# Patient Record
Sex: Male | Born: 1995 | Race: Black or African American | Hispanic: No | Marital: Single | Smoking: Never smoker
Health system: Southern US, Community
[De-identification: ages and names within clinical notes are randomized; demographics above are authoritative.]

## PROBLEM LIST (undated history)

## (undated) DIAGNOSIS — F603 Borderline personality disorder: Secondary | ICD-10-CM

## (undated) DIAGNOSIS — R6250 Unspecified lack of expected normal physiological development in childhood: Secondary | ICD-10-CM

## (undated) DIAGNOSIS — F913 Oppositional defiant disorder: Secondary | ICD-10-CM

## (undated) DIAGNOSIS — F840 Autistic disorder: Secondary | ICD-10-CM

---
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
## 2014-10-11 NOTE — Discharge Summary (Signed)
PATIENT NAME:  Jesse Mckenzie, Jesse Mckenzie MR#:  [id number] DATE OF BIRTH:  08-12-1995  DATE OF ADMISSION:  08/13/2013 DATE OF DISCHARGE:  08/16/2013   HOSPITAL COURSE: See dictated history and physical for details of admission. This 19 year old man was admitted to the hospital with aggressive and threatening behavior at his group home. He has a history of autistic spectrum disorder and treatment for bipolar disorder. The patient did not appear to be manic, but did give a history of mood swings and easy angering and irritability. It appears that he has just relatively recently been moved into a group home, and he was very fixated on the idea that he wanted to go back and live with his mother. It took quite a while for him to understand that this was not going to be possible. In the hospital here, however, he did not engage in any violent or dangerous behavior. He cooperated and participated in groups and individual therapy appropriately. We continued him on his Klonopin, Depakote, and Seroquel and he tolerated these medicines well. At one point, the patient complained to me that he also was having hallucinations and felt like there were monsters crawling on him in his room at night, but he never acted like he was responding to internal stimuli, and at the time of discharge, he is denying all these symptoms. We have spoken with his guardian and his group home. He is okay to be discharged back there today. The patient is expressing better insight and promising not to engage in dangerous behavior. He has been counseled about the importance of keeping his cool and cooperating with treatment and his living situation if he wants to eventually get what he wants.   MENTAL STATUS EXAM AT DISCHARGE: Neatly dressed and groomed young man, looks his stated age. Passively cooperative. Eye contact intermittent. Psychomotor activity sluggish and slow. Speech is slow but easy to understand, somewhat flat tone. Affect is a little bit  blunted and constricted, which seems to be his norm. Mood is stated as okay or good. Thoughts are still concrete and slow but not bizarre. Did not make any obviously delusional statements. Denies that he is having any hallucinations. Denies any suicidal or homicidal ideation. States that he has a better understanding that he needs to behave himself and will not be getting aggressive at home. The patient's judgment and insight are still somewhat impaired but improved. Short-term memory grossly intact, long-term memory grossly intact. Fund of knowledge normal. Alert and oriented x 4.   DISCHARGE MEDICATIONS: Seroquel 800 mg p.o. at night extended-release, clonazepam 1 mg twice a day, Depakote 1500 mg at night.   LABORATORY RESULTS: For this admission, his drug screen was positive for tricyclics, probably relating to the Seroquel. The urinalysis was entirely normal. The CBC showed a low platelet count at 121. Chemistry panel: Elevated CO2 at 32, calcium low at 8.9. Alcohol negative. Acetaminophen and salicylates negative. Valproic acid level 112.   DISPOSITION: The patient will be discharged back to his group home. He follows up with [facility].   DIAGNOSIS, PRINCIPAL AND PRIMARY:  AXIS I: Bipolar disorder, not otherwise specified, mixed symptoms.   SECONDARY DIAGNOSES: AXIS I: No further.  AXIS II: Autistic spectrum disorder, possible Asperger syndrome.  AXIS III: No diagnosis.  AXIS IV: Severe from recently having to relocate from his mother to a group home.  AXIS V: Functioning at time of discharge 45.    ____________________________ Audery AmelJohn T. Brihany Butch, MD jtc:jcm D: 08/16/2013 13:28:44 ET T: 08/16/2013 13:44:45  ET JOB#: [id number]  cc: Audery Amel, MD, <Dictator> Audery Amel MD ELECTRONICALLY SIGNED 08/19/2013 22:05
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
## 2014-10-11 NOTE — Consult Note (Signed)
Brief Consult Note: Diagnosis: Bipolar disorder, Autism spectrum disorder.   Patient was seen by consultant.   Consult note dictated.   Recommend further assessment or treatment.   Orders entered.   Comments: [name] has a h/o autism and mood instability. He was just discharged from [facility]. He returns to the ER as he preferres to be in the hospital rather than in group home where there is nothing to do.   PLAN: 1. The patient does not meet criteria for IVC. He does not benefit from psychiatric hospitalization.   2. He is to continue all medications as prescribed at discharge. No Rx necessary.   3. He will follow up with [facility] team.  4. Group home owner will pick him up.   5. The patient will stay at that group home for the next 3 weeks while his guardian is trying to find him a different place.   6. The guardian in agreement with the plan.  Electronic Signatures: Kristine LineaPucilowska, Jolanta (MD)  (Signed 02-Mar-15 13:28)  Authored: Brief Consult Note   Last Updated: 02-Mar-15 13:28 by Kristine LineaPucilowska, Jolanta (MD)
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
## 2016-12-08 ENCOUNTER — Ambulatory Visit: Payer: Self-pay | Admitting: Podiatry

## 2016-12-27 ENCOUNTER — Encounter: Payer: Medicaid Other | Attending: Physician Assistant | Admitting: Physician Assistant

## 2016-12-27 DIAGNOSIS — F79 Unspecified intellectual disabilities: Secondary | ICD-10-CM | POA: Diagnosis not present

## 2016-12-27 DIAGNOSIS — F429 Obsessive-compulsive disorder, unspecified: Secondary | ICD-10-CM | POA: Diagnosis not present

## 2016-12-27 DIAGNOSIS — L03032 Cellulitis of left toe: Secondary | ICD-10-CM | POA: Insufficient documentation

## 2016-12-27 DIAGNOSIS — L600 Ingrowing nail: Secondary | ICD-10-CM | POA: Diagnosis present

## 2016-12-28 NOTE — Progress Notes (Signed)
SULLIVAN, JACUINDE ([id number]) Visit Report for 12/27/2016 Chief Complaint Document Details Patient Name: Jesse Mckenzie, Jesse Mckenzie Date of Service: 12/27/2016 10:30 AM Medical Record Number: [id number] Patient Account Number: [id number] Date of Birth/Sex: 1996-05-22 (21 y.o. Male) Treating RN: Phillis Haggis Primary Care Provider: Corky Downs Other Clinician: Referring Provider: Corky Downs Treating Provider/Extender: Linwood Dibbles, HOYT Weeks in Treatment: 0 Information Obtained from: Patient Chief Complaint Left 1st toe ingrown toenail Electronic Signature(s) Signed: 12/27/2016 7:35:55 PM By: Lenda Kelp PA-C Entered By: Lenda Kelp on 12/27/2016 11:58:09 Jesse Mckenzie ([id number]) -------------------------------------------------------------------------------- HPI Details Patient Name: Jesse Mckenzie Date of Service: 12/27/2016 10:30 AM Medical Record Number: [id number] Patient Account Number: [id number] Date of Birth/Sex: 05/30/1996 (21 y.o. Male) Treating RN: Phillis Haggis Primary Care Provider: Corky Downs Other Clinician: Referring Provider: Corky Downs Treating Provider/Extender: Linwood Dibbles, HOYT Weeks in Treatment: 0 History of Present Illness HPI Description: 12/27/16 patient presents today for evaluation concerning his left great toe where he has an ingrown toenail on the lateral aspect which has been giving him a lot of discomfort over the past 1-2 months. He has not been on antibiotics at this point he was evaluated by his primary care provider she did refer him to ask for further evaluation. He has also been referred to podiatry initially but due to issues with his Medicaid that appointment was initially canceled and rescheduled but will not be until the end of this month. Subsequently the appointment was made to see [place] which was able to be done sooner in the meantime. Pain has been intermittent mainly with walking he tells me that he was not really able to rate  the pain he just described it as a source/sharp sensation. Fortunately he has no erythema spreading up the toe no evidence of worsening cellulitis. Electronic Signature(s) Signed: 12/27/2016 7:35:55 PM By: Lenda Kelp PA-C Entered By: Lenda Kelp on 12/27/2016 12:45:11 Jesse Mckenzie ([id number]) -------------------------------------------------------------------------------- Maryln Gottron, SIMPLE/SINGLE Details Patient Name: Jesse Mckenzie Date of Service: 12/27/2016 10:30 AM Medical Record Number: [id number] Patient Account Number: [id number] Date of Birth/Sex: 07/17/1995 (21 y.o. Male) Treating RN: Phillis Haggis Primary Care Provider: Corky Downs Other Clinician: Referring Provider: Corky Downs Treating Provider/Extender: Linwood Dibbles, HOYT Weeks in Treatment: 0 Procedure Performed for: Wound #1 Left,Dorsal Toe Great Performed By: Physician Trellis Paganini., PA-C Post Procedure Diagnosis Same as Pre-procedure Notes 6ml Electronic Signature(s) Signed: 12/27/2016 4:36:33 PM By: Alejandro Mulling Entered By: Alejandro Mulling on 12/27/2016 11:23:45 Jesse Mckenzie ([id number]) -------------------------------------------------------------------------------- Physical Exam Details Patient Name: Jesse Mckenzie Date of Service: 12/27/2016 10:30 AM Medical Record Number: [id number] Patient Account Number: [id number] Date of Birth/Sex: 08-29-1995 (21 y.o. Male) Treating RN: Phillis Haggis Primary Care Provider: Corky Downs Other Clinician: Referring Provider: Corky Downs Treating Provider/Extender: STONE III, HOYT Weeks in Treatment: 0 Constitutional sitting or standing blood pressure is within target range for patient.. pulse regular and within target range for patient.Marland Kitchen respirations regular, non-labored and within target range for patient.Marland Kitchen temperature within target range for patient.. Well-nourished and well-hydrated in no acute distress. Eyes conjunctiva  clear no eyelid edema noted. pupils equal round and reactive to light and accommodation. Ears, Nose, Mouth, and Throat no gross abnormality of ear auricles or external auditory canals. normal hearing noted during conversation. mucus membranes moist. Respiratory normal breathing without difficulty. clear to auscultation bilaterally. Cardiovascular regular rate and rhythm with normal S1, S2. 2+ dorsalis pedis/posterior tibialis pulses. no clubbing, cyanosis, significant edema, <3 sec cap refill. Gastrointestinal (  GI) soft, non-tender, non-distended, +BS. no ventral hernia noted. Musculoskeletal normal gait and posture. no significant deformity or arthritic changes, no loss or range of motion, no clubbing. Integumentary (Hair, Skin) normal hair distribution and pattern. Psychiatric Patient is not able to cooperate in decision making regarding care though he was able to understand some of what we were discussing with regard to treatment.. Alert and Oriented x 3. pleasant and cooperative. Notes Patient has some indurated tissue on the lateral aspect where the toenail is growing into the skin which is classic and typical of an ingrown toenail. However there does not appear to be any fluid collection underneath nor any pus filled pocket pocket on evaluation today. Electronic Signature(s) Signed: 12/27/2016 7:35:55 PM By: Lenda Kelp PA-C Entered By: Lenda Kelp on 12/27/2016 12:49:32 Jesse Mckenzie ([id number]) -------------------------------------------------------------------------------- Physician Orders Details Patient Name: Jesse Mckenzie Date of Service: 12/27/2016 10:30 AM Medical Record Number: [id number] Patient Account Number: [id number] Date of Birth/Sex: [birth year] (21 y.o. Male) Treating RN: Phillis Haggis Primary Care Provider: Corky Downs Other Clinician: Referring Provider: Corky Downs Treating Provider/Extender: Linwood Dibbles, HOYT Weeks in Treatment:  0 Verbal / Phone Orders: Yes Clinician: Pinkerton, Debi Read Back and Verified: Yes Diagnosis Coding ICD-10 Coding Code Description L60.0 Ingrowing nail L03.032 Cellulitis of left toe F79 Unspecified intellectual disabilities F42.9 Obsessive-compulsive disorder, unspecified Wound Cleansing Wound #1 Left,Dorsal Toe Great o Clean wound with Normal Saline. o Cleanse wound with mild soap and water o May Shower, gently pat wound dry prior to applying new dressing. Anesthetic Wound #1 Left,Dorsal Toe Great o Topical Lidocaine 4% cream applied to wound bed prior to debridement o Other: - injected 2% lidocaine without epinephrine Primary Wound Dressing Wound #1 Left,Dorsal Toe Great o Xeroform Secondary Dressing Wound #1 Left,Dorsal Toe Great o Conform/Kerlix Dressing Change Frequency Wound #1 Left,Dorsal Toe Great o Change dressing every day. Follow-up Appointments Wound #1 Left,Dorsal Toe Great o Return Appointment in 1 week. Jesse Mckenzie, Jesse Mckenzie ([id number]) Edema Control Wound #1 Left,Dorsal Toe Great o Elevate legs to the level of the heart and pump ankles as often as possible Additional Orders / Instructions Wound #1 Left,Dorsal Toe Great o Increase protein intake. Electronic Signature(s) Signed: 12/27/2016 7:35:55 PM By: Lenda Kelp PA-C Entered By: Lenda Kelp on 12/27/2016 12:49:52 Jesse Mckenzie ([id number]) -------------------------------------------------------------------------------- Problem List Details Patient Name: Jesse Mckenzie Date of Service: 12/27/2016 10:30 AM Medical Record Number: [id number] Patient Account Number: [id number] Date of Birth/Sex: 11-10-1995 (20 y.o. Male) Treating RN: Phillis Haggis Primary Care Provider: Corky Downs Other Clinician: Referring Provider: Corky Downs Treating Provider/Extender: Linwood Dibbles, HOYT Weeks in Treatment: 0 Active Problems ICD-10 Encounter Code Description Active  Date Diagnosis L60.0 Ingrowing nail 12/27/2016 Yes L03.032 Cellulitis of left toe 12/27/2016 Yes F79 Unspecified intellectual disabilities 12/27/2016 Yes F42.9 Obsessive-compulsive disorder, unspecified 12/27/2016 Yes Inactive Problems Resolved Problems Electronic Signature(s) Signed: 12/27/2016 7:35:55 PM By: Lenda Kelp PA-C Entered By: Lenda Kelp on 12/27/2016 11:54:14 Jesse Mckenzie ([id number]) -------------------------------------------------------------------------------- Progress Note Details Patient Name: Jesse Mckenzie Date of Service: 12/27/2016 10:30 AM Medical Record Number: [id number] Patient Account Number: [id number] Date of Birth/Sex: 12/14/1995 (20 y.o. Male) Treating RN: Phillis Haggis Primary Care Provider: Corky Downs Other Clinician: Referring Provider: Corky Downs Treating Provider/Extender: Linwood Dibbles, HOYT Weeks in Treatment: 0 Subjective Chief Complaint Information obtained from Patient Left 1st toe ingrown toenail History of Present Illness (HPI) 12/27/16 patient presents today for evaluation concerning his left great toe where he has an  ingrown toenail on the lateral aspect which has been giving him a lot of discomfort over the past 1-2 months. He has not been on antibiotics at this point he was evaluated by his primary care provider she did refer him to ask for further evaluation. He has also been referred to podiatry initially but due to issues with his Medicaid that appointment was initially canceled and rescheduled but will not be until the end of this month. Subsequently the appointment was made to see [place] which was able to be done sooner in the meantime. Pain has been intermittent mainly with walking he tells me that he was not really able to rate the pain he just described it as a source/sharp sensation. Fortunately he has no erythema spreading up the toe no evidence of worsening cellulitis. Patient History Allergies NKDA Social  History Never smoker, Marital Status - Single, Alcohol Use - Never, Drug Use - No History, Caffeine Use - Rarely. Review of Systems (ROS) Constitutional Symptoms (General Health) The patient has no complaints or symptoms. Eyes The patient has no complaints or symptoms. Ear/Nose/Mouth/Throat The patient has no complaints or symptoms. Hematologic/Lymphatic The patient has no complaints or symptoms. Respiratory The patient has no complaints or symptoms. Cardiovascular The patient has no complaints or symptoms. Gastrointestinal Jesse Mckenzie, Jesse Mckenzie ([id number]) The patient has no complaints or symptoms. Endocrine The patient has no complaints or symptoms. Genitourinary The patient has no complaints or symptoms. Immunological The patient has no complaints or symptoms. Integumentary (Skin) The patient has no complaints or symptoms. Musculoskeletal Complains or has symptoms of Muscle Pain. Neurologic The patient has no complaints or symptoms. Oncologic The patient has no complaints or symptoms. Psychiatric Mental disabilities Objective Constitutional sitting or standing blood pressure is within target range for patient.. pulse regular and within target range for patient.Marland Kitchen respirations regular, non-labored and within target range for patient.Marland Kitchen temperature within target range for patient.. Well-nourished and well-hydrated in no acute distress. Vitals Time Taken: 10:21 AM, Height: 70 in, Source: Stated, Weight: 192.2 lbs, Source: Measured, BMI: 27.6, Temperature: 97.8 F, Pulse: 92 bpm, Respiratory Rate: 20 breaths/min, Blood Pressure: 109/73 mmHg. Eyes conjunctiva clear no eyelid edema noted. pupils equal round and reactive to light and accommodation. Ears, Nose, Mouth, and Throat no gross abnormality of ear auricles or external auditory canals. normal hearing noted during conversation. mucus membranes moist. Respiratory normal breathing without difficulty. clear to auscultation  bilaterally. Cardiovascular regular rate and rhythm with normal S1, S2. 2+ dorsalis pedis/posterior tibialis pulses. no clubbing, cyanosis, significant edema, Weger, Raun R. ([id number]) Gastrointestinal (GI) soft, non-tender, non-distended, +BS. no ventral hernia noted. Musculoskeletal normal gait and posture. no significant deformity or arthritic changes, no loss or range of motion, no clubbing. Psychiatric Patient is not able to cooperate in decision making regarding care though he was able to understand some of what we were discussing with regard to treatment.. Alert and Oriented x 3. pleasant and cooperative. General Notes: Patient has some indurated tissue on the lateral aspect where the toenail is growing into the skin which is classic and typical of an ingrown toenail. However there does not appear to be any fluid collection underneath nor any pus filled pocket pocket on evaluation today. Integumentary (Hair, Skin) normal hair distribution and pattern. Wound #1 status is Open. Original cause of wound was Gradually Appeared. The wound is located on the [employer]. The wound measures 0.3cm length x 0.9cm width x 0.1cm depth; 0.212cm^2 area and 0.021cm^3 volume. There is no tunneling or  undermining noted. There is a large amount of purulent drainage noted. The wound margin is distinct with the outline attached to the wound base. There is no granulation within the wound bed. There is a large (67-100%) amount of necrotic tissue within the wound bed including Eschar and Adherent Slough. The periwound skin appearance exhibited: Dry/Scaly, Erythema. The surrounding wound skin color is noted with erythema which is circumferential. Periwound temperature was noted as No Abnormality. The periwound has tenderness on palpation. Assessment Active Problems ICD-10 L60.0 - Ingrowing nail L03.032 - Cellulitis of left toe F79 - Unspecified intellectual disabilities F42.9 -  Obsessive-compulsive disorder, unspecified Procedures Wound #1 Pre-procedure diagnosis of Wound #1 is a To be determined located on the Left,Dorsal Toe Great . An Jesse FreerGRIER, Jesse R. ([id number]) AVULSION NAIL, SIMPLE/SINGLE procedure was performed by STONE III, HOYT E., PA-C. Post procedure Diagnosis Wound #1: Same as Pre-Procedure Notes: 6ml Verbal informed consent: was obtained for Left 1st toe ingrown toenail removal. Risk and benefits were discussed with patient. Patient verbalized understanding that this ingrown toenail can help the wound heal by removing the region of insult. I also explained that sometimes portion although this is rare. Bleeding and infection are more common complications and he understands as does his caregiver who is here with him today. Procedure: Procedure time: 11:35 Local anesthetic in the form of 6cc of 2% lidocaine was injected for a digital block prior to the start of the procedure Utilizing sissors and forcepts the area of ingrown toenail laterally was first lifted then sectioned off, and finally removed with firm pressure after I had cut it up to the cuticle location. I informed the patient as well his caregiver that the bleeding is normlay and to be expected but he had minimal bleeding with <1cc bloodloss. Patient tolerated the procedure well with no discomfort once he had been numbed. Plan Wound Cleansing: Wound #1 Left,Dorsal Toe Great: Clean wound with Normal Saline. Cleanse wound with mild soap and water May Shower, gently pat wound dry prior to applying new dressing. Anesthetic: Wound #1 Left,Dorsal Toe Great: Topical Lidocaine 4% cream applied to wound bed prior to debridement Other: - injected 2% lidocaine without epinephrine Primary Wound Dressing: Wound #1 Left,Dorsal Toe Great: Rosalene BillingsXeroform Jesse Mckenzie, Jesse R. ([id number]) Secondary Dressing: Wound #1 Left,Dorsal Toe Great: Conform/Kerlix Dressing Change Frequency: Wound #1 Left,Dorsal Toe  Great: Change dressing every day. Follow-up Appointments: Wound #1 Left,Dorsal Toe Great: Return Appointment in 1 week. Edema Control: Wound #1 Left,Dorsal Toe Great: Elevate legs to the level of the heart and pump ankles as often as possible Additional Orders / Instructions: Wound #1 Left,Dorsal Toe Great: Increase protein intake. Patient tolerated the lateral removal during the office visit today without any complication. Following removal with Xeroform and then Kerlex and Coban provide some pressure and stop bleeding. This should be changed daily and patient as well as his caretaker should watch out for signs of this wrapping being too tight and if so loosen it or take it off completely and reapply. We will see were things stand in one week. Hopefully this should do well I did explain that one of the risk of an ingrown toenail is that potentially it can be recurrent and sometimes the entire toenail has to be removed and then cauterized to prevent any nail growth at all. Hopefully that will not become necessary. Electronic Signature(s) Signed: 12/27/2016 7:35:55 PM By: Lenda KelpStone III, Hoyt PA-C Entered By: Lenda KelpStone III, Hoyt on 12/27/2016 13:14:22 Jesse FreerGRIER, Patsy R. ([id number]) -------------------------------------------------------------------------------- ROS/PFSH Details  Patient Name: Jesse Mckenzie, Jesse Mckenzie Date of Service: 12/27/2016 10:30 AM Medical Record Number: [id number] Patient Account Number: [id number] Date of Birth/Sex: 1995/09/08 (20 y.o. Male) Treating RN: Ashok Cordia, Debi Primary Care Provider: Corky Downs Other Clinician: Referring Provider: Corky Downs Treating Provider/Extender: STONE III, HOYT Weeks in Treatment: 0 Wound History Musculoskeletal Complaints and Symptoms: Positive for: Muscle Pain Constitutional Symptoms (General Health) Complaints and Symptoms: No Complaints or Symptoms Eyes Complaints and Symptoms: No Complaints or  Symptoms Ear/Nose/Mouth/Throat Complaints and Symptoms: No Complaints or Symptoms Hematologic/Lymphatic Complaints and Symptoms: No Complaints or Symptoms Respiratory Complaints and Symptoms: No Complaints or Symptoms Cardiovascular Complaints and Symptoms: No Complaints or Symptoms Gastrointestinal Complaints and Symptoms: No Complaints or Symptoms Endocrine ORVIN, NETTER ([id number]) Complaints and Symptoms: No Complaints or Symptoms Genitourinary Complaints and Symptoms: No Complaints or Symptoms Immunological Complaints and Symptoms: No Complaints or Symptoms Integumentary (Skin) Complaints and Symptoms: No Complaints or Symptoms Neurologic Complaints and Symptoms: No Complaints or Symptoms Oncologic Complaints and Symptoms: No Complaints or Symptoms Psychiatric Complaints and Symptoms: Review of System Notes: Mental disabilities Immunizations Pneumococcal Vaccine: Received Pneumococcal Vaccination: No Family and Social History Never smoker; Marital Status - Single; Alcohol Use: Never; Drug Use: No History; Caffeine Use: Rarely; Financial Concerns: No; Food, Clothing or Shelter Needs: No; Support System Lacking: No; Transportation Concerns: No; Advanced Directives: No; Do not resuscitate: No; Living Will: No; Medical Power of Attorney: No Electronic Signature(s) Signed: 12/27/2016 4:36:33 PM By: Alejandro Mulling Signed: 12/27/2016 7:35:55 PM By: Lenda Kelp PA-C Entered By: Alejandro Mulling on 12/27/2016 10:26:24 Jesse Mckenzie ([id number]) -------------------------------------------------------------------------------- SuperBill Details Patient Name: Jesse Mckenzie Date of Service: 12/27/2016 Medical Record Number: [id number] Patient Account Number: [id number] Date of Birth/Sex: 18-Feb-1996 (20 y.o. Male) Treating RN: Phillis Haggis Primary Care Provider: Corky Downs Other Clinician: Referring Provider: Corky Downs Treating  Provider/Extender: Linwood Dibbles, HOYT Weeks in Treatment: 0 Diagnosis Coding ICD-10 Codes Code Description L60.0 Ingrowing nail L03.032 Cellulitis of left toe F79 Unspecified intellectual disabilities F42.9 Obsessive-compulsive disorder, unspecified Facility Procedures CPT4 Code: [id number] Description: 99214 - WOUND CARE VISIT-LEV 4 EST PT Modifier: Quantity: 1 CPT4 Code: [id number] Description: 11730 - AVULSION NAIL, SIMPLE/SINGLE ICD-10 Description Diagnosis L60.0 Ingrowing nail L03.032 Cellulitis of left toe Modifier: Quantity: 1 Physician Procedures CPT4 Code: 2595638 Description: WC PHYS LEVEL 3 o NEW PT ICD-10 Description Diagnosis L60.0 Ingrowing nail L03.032 Cellulitis of left toe F79 Unspecified intellectual disabilities F42.9 Obsessive-compulsive disorder, unspecified Modifier: 25 Quantity: 1 CPT4 Code: [id number] Martie Round Description: 11730 - WC PHYS AVULSION NAILPLATE SIM/SING RJJ-88 Description Diagnosis L60.0 Ingrowing nail L03.032 Cellulitis of left toe Shaune Leeks ([id number]) Modifier: Quantity: 1 Electronic Signature(s) Signed: 12/27/2016 4:36:33 PM By: Alejandro Mulling Signed: 12/27/2016 7:35:55 PM By: Lenda Kelp PA-C Entered By: Alejandro Mulling on 12/27/2016 13:19:29

## 2016-12-28 NOTE — Progress Notes (Signed)
Jesse Jesse Mckenzie, Jesse R. ([id number]) Visit Report for 12/27/2016 Abuse/Suicide Risk Screen Details Patient Name: Jesse Jesse Mckenzie, Jesse R. Date of Service: 12/27/2016 10:30 AM Medical Record Number: [id number] Patient Account Number: [id number] Date of Birth/Sex: 06/20/1996 (20 y.o. Male) Treating RN: Ashok CordiaPinkerton, Debi Primary Care Milliani Herrada: Corky DownsMASOUD, JAVED Other Clinician: Referring Meshilem Machuca: Corky DownsMASOUD, JAVED Treating Staceyann Knouff/Extender: STONE III, HOYT Weeks in Treatment: 0 Abuse/Suicide Risk Screen Items Answer ABUSE/SUICIDE RISK SCREEN: Has anyone close to you tried to hurt or harm you recentlyo No Do you feel uncomfortable with anyone in your familyo No Has anyone forced you do things that you didnot want to doo No Do you have any thoughts of harming yourselfo No Patient displays signs or symptoms of abuse and/or neglect. No Electronic Signature(s) Signed: 12/27/2016 4:36:33 PM By: Alejandro MullingPinkerton, Debra Entered By: Alejandro MullingPinkerton, Debra on 12/27/2016 10:26:34 Jesse Jesse Mckenzie, Dsean R. ([id number]) -------------------------------------------------------------------------------- Activities of Daily Living Details Patient Name: Jesse Jesse Mckenzie, Jesse R. Date of Service: 12/27/2016 10:30 AM Medical Record Number: [id number] Patient Account Number: [id number] Date of Birth/Sex: 05/02/1996 (20 y.o. Male) Treating RN: Phillis HaggisPinkerton, Debi Primary Care Gailen Venne: Corky DownsMASOUD, JAVED Other Clinician: Referring Jeanette Rauth: Corky DownsMASOUD, JAVED Treating Lendy Dittrich/Extender: Linwood DibblesSTONE III, HOYT Weeks in Treatment: 0 Activities of Daily Living Items Answer Activities of Daily Living (Please select one for each item) Drive Automobile Not Able Take Medications Not Able Use Telephone Need Assistance Care for Appearance Completely Able Use Toilet Completely Able Bath / Shower Completely Able Dress Self Completely Able Feed Self Completely Able Walk Completely Able Get In / Out Bed Completely Able Housework Completely Able Prepare Meals Not Able Handle Money Not  Able Shop for Self Not Able Electronic Signature(s) Signed: 12/27/2016 4:36:33 PM By: Alejandro MullingPinkerton, Debra Entered By: Alejandro MullingPinkerton, Debra on 12/27/2016 10:27:19 Jesse Jesse Mckenzie, Marquinn R. ([id number]) -------------------------------------------------------------------------------- Education Assessment Details Patient Name: Jesse Jesse Mckenzie, Jesse R. Date of Service: 12/27/2016 10:30 AM Medical Record Number: [id number] Patient Account Number: [id number] Date of Birth/Sex: 01/16/1996 (20 y.o. Male) Treating RN: Phillis HaggisPinkerton, Debi Primary Care Dora Simeone: Corky DownsMASOUD, JAVED Other Clinician: Referring Nyssa Sayegh: Corky DownsMASOUD, JAVED Treating Myeshia Fojtik/Extender: Linwood DibblesSTONE III, HOYT Weeks in Treatment: 0 Primary Learner Assessed: Patient Learning Preferences/Education Level/Primary Language Learning Preference: Explanation, Printed Material Highest Education Level: High School Preferred Language: English Cognitive Barrier Assessment/Beliefs Language Barrier: No Translator Needed: No Memory Deficit: No Emotional Barrier: No Cultural/Religious Beliefs Affecting Medical No Care: Physical Barrier Assessment Impaired Vision: No Impaired Hearing: No Decreased Hand dexterity: No Knowledge/Comprehension Assessment Knowledge Level: Medium Comprehension Level: Medium Ability to understand written Medium instructions: Ability to understand verbal Medium instructions: Motivation Assessment Anxiety Level: Calm Cooperation: Cooperative Education Importance: Acknowledges Need Interest in Health Problems: Asks Questions Perception: Coherent Willingness to Engage in Self- Medium Management Activities: Readiness to Engage in Self- Medium Management Activities: Electronic Signature(s) Jesse Jesse Mckenzie, [name] ([id number]) Signed: 12/27/2016 4:36:33 PM By: Alejandro MullingPinkerton, Debra Entered By: Alejandro MullingPinkerton, Debra on 12/27/2016 10:27:46 Jesse Jesse Mckenzie, [name] ([id number]) -------------------------------------------------------------------------------- Fall  Risk Assessment Details Patient Name: Jesse Jesse Mckenzie, Jesse R. Date of Service: 12/27/2016 10:30 AM Medical Record Number: [id number] Patient Account Number: [id number] Date of Birth/Sex: 03/31/1996 (20 y.o. Male) Treating RN: Ashok CordiaPinkerton, Debi Primary Care Makeshia Seat: Corky DownsMASOUD, JAVED Other Clinician: Referring Corrisa Gibby: Corky DownsMASOUD, JAVED Treating Jesse Jesse Mckenzie/Extender: Linwood DibblesSTONE III, HOYT Weeks in Treatment: 0 Fall Risk Assessment Items Have you had 2 or more falls in the last 12 monthso 0 No Have you had any fall that resulted in injury in the last 12 monthso 0 No FALL RISK ASSESSMENT: History of falling - immediate or within 3 months 0 No Secondary diagnosis 0 No Ambulatory aid None/bed rest/wheelchair/nurse 0 No  Crutches/cane/walker 0 No Furniture 0 No IV Access/Saline Lock 0 No Gait/Training Normal/bed rest/immobile 0 No Weak 0 No Impaired 0 No Mental Status Oriented to own ability 0 Yes Electronic Signature(s) Signed: 12/27/2016 4:36:33 PM By: Alejandro Mulling Entered By: Alejandro Mulling on 12/27/2016 10:28:03 Jesse Jesse Mckenzie ([id number]) -------------------------------------------------------------------------------- Foot Assessment Details Patient Name: Jesse Jesse Mckenzie Date of Service: 12/27/2016 10:30 AM Medical Record Number: [id number] Patient Account Number: [id number] Date of Birth/Sex: December 14, 1995 (20 y.o. Male) Treating RN: Phillis Haggis Primary Care Almarie Kurdziel: Corky Downs Other Clinician: Referring Deshaun Weisinger: Corky Downs Treating Bing Duffey/Extender: STONE III, HOYT Weeks in Treatment: 0 Foot Assessment Items Site Locations + = Sensation present, - = Sensation absent, C = Callus, U = Ulcer R = Redness, W = Warmth, M = Maceration, PU = Pre-ulcerative lesion F = Fissure, S = Swelling, D = Dryness Assessment Right: Left: Other Deformity: No No Prior Foot Ulcer: No No Prior Amputation: No No Charcot Joint: No No Ambulatory Status: Ambulatory Without Help Gait:  Steady Electronic Signature(s) Signed: 12/27/2016 4:36:33 PM By: Alejandro Mulling Entered By: Alejandro Mulling on 12/27/2016 10:30:46 Jesse Jesse Mckenzie ([id number]) -------------------------------------------------------------------------------- Nutrition Risk Assessment Details Patient Name: Jesse Jesse Mckenzie Date of Service: 12/27/2016 10:30 AM Medical Record Number: [id number] Patient Account Number: [id number] Date of Birth/Sex: August 14, 1995 (20 y.o. Male) Treating RN: Ashok Cordia, Debi Primary Care Santiago Stenzel: Corky Downs Other Clinician: Referring Samanatha Brammer: Corky Downs Treating Alanson Hausmann/Extender: STONE III, HOYT Weeks in Treatment: 0 Height (in): 70 Weight (lbs): 192.2 Body Mass Index (BMI): 27.6 Nutrition Risk Assessment Items NUTRITION RISK SCREEN: I have an illness or condition that made me change the kind and/or 0 No amount of food I eat I eat fewer than two meals per day 0 No I eat few fruits and vegetables, or milk products 0 No I have three or more drinks of beer, liquor or wine almost every day 0 No I have tooth or mouth problems that make it hard for me to eat 0 No I don't always have enough money to buy the food I need 0 No I eat alone most of the time 0 No I take three or more different prescribed or over-the-counter drugs a 0 No day Without wanting to, I have lost or gained 10 pounds in the last six 0 No months I am not always physically able to shop, cook and/or feed myself 2 Yes Nutrition Protocols Good Risk Protocol Moderate Risk Protocol Electronic Signature(s) Signed: 12/27/2016 4:36:33 PM By: Alejandro Mulling Entered By: Alejandro Mulling on 12/27/2016 10:28:14

## 2016-12-28 NOTE — Progress Notes (Signed)
Jesse Mckenzie, Forney R. ([id number]) Visit Report for 12/27/2016 Allergy List Details Patient Name: Jesse Mckenzie, Akshat R. Date of Service: 12/27/2016 10:30 AM Medical Record Number: [id number] Patient Account Number: [id number] Date of Birth/Sex: 06/07/1996 (21 y.o. Male) Treating RN: Phillis HaggisPinkerton, Debi Primary Care Kaizlee Carlino: Corky DownsMASOUD, JAVED Other Clinician: Referring Tanicia Wolaver: Corky DownsMASOUD, JAVED Treating Toye Rouillard/Extender: Maxwell CaulOBSON, MICHAEL G Weeks in Treatment: 0 Allergies Active Allergies NKDA Allergy Notes Electronic Signature(s) Signed: 12/27/2016 4:36:33 PM By: Alejandro MullingPinkerton, Debra Entered By: Alejandro MullingPinkerton, Debra on 12/27/2016 10:23:19 Jesse Mckenzie, Deldrick R. ([id number]) -------------------------------------------------------------------------------- Arrival Information Details Patient Name: Jesse Mckenzie, Jesse R. Date of Service: 12/27/2016 10:30 AM Medical Record Number: [id number] Patient Account Number: [id number] Date of Birth/Sex: 06/08/1996 (21 y.o. Male) Treating RN: Phillis HaggisPinkerton, Debi Primary Care Norissa Bartee: Corky DownsMASOUD, JAVED Other Clinician: Referring Kapri Nero: Corky DownsMASOUD, JAVED Treating Pascual Mantel/Extender: Altamese CarolinaOBSON, MICHAEL G Weeks in Treatment: 0 Visit Information Patient Arrived: Ambulatory Arrival Time: 10:19 Accompanied By: caregiver Transfer Assistance: None Patient Identification Verified: Yes Secondary Verification Process Yes Completed: Patient Requires Transmission-Based No Precautions: Patient Has Alerts: No Electronic Signature(s) Signed: 12/27/2016 4:36:33 PM By: Alejandro MullingPinkerton, Debra Entered By: Alejandro MullingPinkerton, Debra on 12/27/2016 10:19:28 Jesse Mckenzie, Esley R. ([id number]) -------------------------------------------------------------------------------- Clinic Level of Care Assessment Details Patient Name: Jesse Mckenzie, Jesse R. Date of Service: 12/27/2016 10:30 AM Medical Record Number: [id number] Patient Account Number: [id number] Date of Birth/Sex: 12/25/1995 (21 y.o. Male) Treating RN: Ashok CordiaPinkerton, Debi Primary Care  Sanoe Hazan: Corky DownsMASOUD, JAVED Other Clinician: Referring Abbygale Lapid: Corky DownsMASOUD, JAVED Treating Nahdia Doucet/Extender: Linwood DibblesSTONE III, HOYT Weeks in Treatment: 0 Clinic Level of Care Assessment Items TOOL 1 Quantity Score X - Use when EandM and Procedure is performed on INITIAL visit 1 0 ASSESSMENTS - Nursing Assessment / Reassessment X - General Physical Exam (combine w/ comprehensive assessment (listed just 1 20 below) when performed on new pt. evals) X - Comprehensive Assessment (HX, ROS, Risk Assessments, Wounds Hx, etc.) 1 25 ASSESSMENTS - Wound and Skin Assessment / Reassessment []  - Dermatologic / Skin Assessment (not related to wound area) 0 ASSESSMENTS - Ostomy and/or Continence Assessment and Care []  - Incontinence Assessment and Management 0 []  - Ostomy Care Assessment and Management (repouching, etc.) 0 PROCESS - Coordination of Care []  - Simple Patient / Family Education for ongoing care 0 X - Complex (extensive) Patient / Family Education for ongoing care 1 20 X - Staff obtains ChiropractorConsents, Records, Test Results / Process Orders 1 10 X - Staff telephones HHA, Nursing Homes / Clarify orders / etc 1 10 []  - Routine Transfer to another Facility (non-emergent condition) 0 []  - Routine Hospital Admission (non-emergent condition) 0 X - New Admissions / Manufacturing engineernsurance Authorizations / Ordering NPWT, Apligraf, etc. 1 15 []  - Emergency Hospital Admission (emergent condition) 0 PROCESS - Special Needs []  - Pediatric / Minor Patient Management 0 []  - Isolation Patient Management 0 Jesse Mckenzie, Jesse R. ([id number]) []  - Hearing / Language / Visual special needs 0 []  - Assessment of Community assistance (transportation, D/C planning, etc.) 0 X - Additional assistance / Altered mentation 1 15 []  - Support Surface(s) Assessment (bed, cushion, seat, etc.) 0 INTERVENTIONS - Miscellaneous []  - External ear exam 0 []  - Patient Transfer (multiple staff / Nurse, adultHoyer Lift / Similar devices) 0 []  - Simple Staple / Suture  removal (25 or less) 0 []  - Complex Staple / Suture removal (26 or more) 0 []  - Hypo/Hyperglycemic Management (do not check if billed separately) 0 X - Ankle / Brachial Index (ABI) - do not check if billed separately 1 15 Has the patient been seen at the hospital within the last  three years: Yes Total Score: 130 Level Of Care: New/Established - Level 4 Electronic Signature(s) Signed: 12/27/2016 4:36:33 PM By: Alejandro Mulling Entered By: Alejandro Mulling on 12/27/2016 13:19:22 Jesse Mckenzie ([id number]) -------------------------------------------------------------------------------- Encounter Discharge Information Details Patient Name: Jesse Mckenzie Date of Service: 12/27/2016 10:30 AM Medical Record Number: [id number] Patient Account Number: [id number] Date of Birth/Sex: March 30, 1996 (21 y.o. Male) Treating RN: Phillis Haggis Primary Care Amily Depp: Corky Downs Other Clinician: Referring Jesse Mckenzie: Corky Downs Treating Jesse Mckenzie/Extender: Linwood Dibbles, HOYT Weeks in Treatment: 0 Encounter Discharge Information Items Discharge Pain Level: 0 Discharge Condition: Stable Ambulatory Status: Ambulatory Other (Note Discharge Destination: Required) Transportation: Private Auto Accompanied By: caregiver Schedule Follow-up Appointment: Yes Medication Reconciliation completed and provided to Patient/Care No Marye Eagen: Provided on Clinical Summary of Care: 12/27/2016 Form Type Recipient Paper Patient AG Electronic Signature(s) Signed: 12/27/2016 11:44:56 AM By: Gwenlyn Perking Entered By: Gwenlyn Perking on 12/27/2016 11:44:55 Jesse Mckenzie ([id number]) -------------------------------------------------------------------------------- Lower Extremity Assessment Details Patient Name: Jesse Mckenzie Date of Service: 12/27/2016 10:30 AM Medical Record Number: [id number] Patient Account Number: [id number] Date of Birth/Sex: 09-22-1995 (21 y.o. Male) Treating RN: Phillis Haggis Primary  Care Kimberlyn Quiocho: Corky Downs Other Clinician: Referring Javaun Dimperio: Corky Downs Treating Helga Asbury/Extender: STONE III, HOYT Weeks in Treatment: 0 Vascular Assessment Pulses: Dorsalis Pedis Palpable: [Left:Yes] Doppler Audible: [Left:Yes] Posterior Tibial Palpable: [Left:Yes] Doppler Audible: [Left:Yes] Extremity colors, hair growth, and conditions: Extremity Color: [Left:Normal] Hair Growth on Extremity: [Left:Yes] Temperature of Extremity: [Left:Warm] Capillary Refill: [Left:< 3 seconds] Blood Pressure: Brachial: [Left:110] Dorsalis Pedis: 130 [Left:Dorsalis Pedis:] Ankle: Posterior Tibial: 120 [Left:Posterior Tibial: 1.18] Toe Nail Assessment Left: Right: Thick: No Discolored: No Deformed: No Improper Length and Hygiene: No Electronic Signature(s) Signed: 12/27/2016 4:36:33 PM By: Alejandro Mulling Entered By: Alejandro Mulling on 12/27/2016 10:37:05 Jesse Mckenzie ([id number]) -------------------------------------------------------------------------------- Multi Wound Chart Details Patient Name: Jesse Mckenzie Date of Service: 12/27/2016 10:30 AM Medical Record Number: [id number] Patient Account Number: [id number] Date of Birth/Sex: 23-Jan-1996 (21 y.o. Male) Treating RN: Phillis Haggis Primary Care Oceanna Arruda: Corky Downs Other Clinician: Referring Danisa Kopec: Corky Downs Treating Ommie Degeorge/Extender: STONE III, HOYT Weeks in Treatment: 0 Vital Signs Height(in): 70 Pulse(bpm): 92 Weight(lbs): 192.2 Blood Pressure 109/73 (mmHg): Body Mass Index(BMI): 28 Temperature(F): 97.8 Respiratory Rate 20 (breaths/min): Photos: [1:No Photos] [N/A:N/A] Wound Location: [1:Left Toe Great - Dorsal] [N/A:N/A] Wounding Event: [1:Gradually Appeared] [N/A:N/A] Primary Etiology: [1:To be determined] [N/A:N/A] Date Acquired: [1:11/27/2016] [N/A:N/A] Weeks of Treatment: [1:0] [N/A:N/A] Wound Status: [1:Open] [N/A:N/A] Measurements L x W x D 0.3x0.9x0.1 [N/A:N/A] (cm) Area  (cm) : [1:0.212] [N/A:N/A] Volume (cm) : [1:0.021] [N/A:N/A] % Reduction in Area: [1:0.00%] [N/A:N/A] % Reduction in Volume: 0.00% [N/A:N/A] Classification: [1:Partial Thickness] [N/A:N/A] Exudate Amount: [1:Large] [N/A:N/A] Exudate Type: [1:Purulent] [N/A:N/A] Exudate Color: [1:yellow, brown, green] [N/A:N/A] Wound Margin: [1:Distinct, outline attached] [N/A:N/A] Granulation Amount: [1:None Present (0%)] [N/A:N/A] Necrotic Amount: [1:Large (67-100%)] [N/A:N/A] Necrotic Tissue: [1:Eschar, Adherent Slough] [N/A:N/A] Epithelialization: [1:None] [N/A:N/A] Periwound Skin Texture: No Abnormalities Noted [N/A:N/A] Periwound Skin [1:Dry/Scaly: Yes] [N/A:N/A] Moisture: Periwound Skin Color: Erythema: Yes [N/A:N/A] Erythema Location: [1:Circumferential] [N/A:N/A] Temperature: [1:No Abnormality Yes] [N/A:N/A N/A] Tenderness on Palpation: Wound Preparation: Ulcer Cleansing: N/A N/A Rinsed/Irrigated with Saline Topical Anesthetic Applied: Other: lidocaine 4% Treatment Notes Electronic Signature(s) Signed: 12/27/2016 4:36:33 PM By: Alejandro Mulling Entered By: Alejandro Mulling on 12/27/2016 10:47:34 Jesse Mckenzie ([id number]) -------------------------------------------------------------------------------- Multi-Disciplinary Care Plan Details Patient Name: Jesse Mckenzie Date of Service: 12/27/2016 10:30 AM Medical Record Number: [id number] Patient Account Number: [id number] Date of Birth/Sex: [birth year] (21 y.o. Male) Treating RN: Ashok Cordia, [place]  Primary Care Cyndel Griffey: Corky Downs Other Clinician: Referring Reily Ilic: Corky Downs Treating Juston Goheen/Extender: Linwood Dibbles, HOYT Weeks in Treatment: 0 Active Inactive ` Orientation to the Wound Care Program Nursing Diagnoses: Knowledge deficit related to the wound healing center program Goals: Patient/caregiver will verbalize understanding of the Wound Healing Center Program Date Initiated: 12/27/2016 Target Resolution Date:  01/21/2017 Goal Status: Active Interventions: Provide education on orientation to the wound center Notes: ` Pain, Acute or Chronic Nursing Diagnoses: Pain, acute or chronic: actual or potential Potential alteration in comfort, pain Goals: Patient/caregiver will verbalize adequate pain control between visits Date Initiated: 12/27/2016 Target Resolution Date: 04/29/2017 Goal Status: Active Interventions: Complete pain assessment as per visit requirements Notes: Electronic Signature(s) Signed: 12/27/2016 4:36:33 PM By: Alejandro Mulling Entered By: Alejandro Mulling on 12/27/2016 11:36:52 Jesse Mckenzie ([id number] Mckenzie ([id number]) -------------------------------------------------------------------------------- Pain Assessment Details Patient Name: Jesse Mckenzie Date of Service: 12/27/2016 10:30 AM Medical Record Number: [id number] Patient Account Number: [id number] Date of Birth/Sex: [birth year] (21 y.o. Male) Treating RN: Phillis Haggis Primary Care Ilean Spradlin: Corky Downs Other Clinician: Referring Jahfari Ambers: Corky Downs Treating Jeffey Janssen/Extender: Maxwell Caul Weeks in Treatment: 0 Active Problems Location of Pain Severity and Description of Pain Patient Has Paino No Site Locations With Dressing Change: No Pain Management and Medication Current Pain Management: Electronic Signature(s) Signed: 12/27/2016 4:36:33 PM By: Alejandro Mulling Entered By: Alejandro Mulling on 12/27/2016 10:21:03 Jesse Mckenzie ([id number]) -------------------------------------------------------------------------------- Patient/Caregiver Education Details Patient Name: Jesse Mckenzie Date of Service: 12/27/2016 10:30 AM Medical Record Number: [id number] Patient Account Number: [id number] Date of Birth/Gender: [birth year] (21 y.o. Male) Treating RN: Phillis Haggis Primary Care Physician: Corky Downs Other Clinician: Referring Physician: Corky Downs Treating  Physician/Extender: Skeet Simmer in Treatment: 0 Education Assessment Education Provided To: Patient and Caregiver Education Topics Provided Welcome To The Wound Care Center: Handouts: Welcome To The [facility] Methods: Explain/Verbal Responses: State content correctly Wound/Skin Impairment: Handouts: Other: change dressing as ordered Methods: Demonstration, Explain/Verbal Responses: State content correctly Electronic Signature(s) Signed: 12/27/2016 4:36:33 PM By: Alejandro Mulling Entered By: Alejandro Mulling on 12/27/2016 11:38:11 Jesse Mckenzie ([id number]) -------------------------------------------------------------------------------- Wound Assessment Details Patient Name: Jesse Mckenzie Date of Service: 12/27/2016 10:30 AM Medical Record Number: [id number] Patient Account Number: [id number] Date of Birth/Sex: 1995-11-12 (21 y.o. Male) Treating RN: Phillis Haggis Primary Care Vennesa Bastedo: Corky Downs Other Clinician: Referring Milania Haubner: Corky Downs Treating Jniyah Dantuono/Extender: STONE III, HOYT Weeks in Treatment: 0 Wound Status Wound Number: 1 Primary Etiology: To be determined Wound Location: Left Toe Great - Dorsal Wound Status: Open Wounding Event: Gradually Appeared Date Acquired: 11/27/2016 Weeks Of Treatment: 0 Clustered Wound: No Photos Photo Uploaded By: Alejandro Mulling on 12/27/2016 13:15:34 Wound Measurements Length: (cm) 0.3 Width: (cm) 0.9 Depth: (cm) 0.1 Area: (cm) 0.212 Volume: (cm) 0.021 % Reduction in Area: 0% % Reduction in Volume: 0% Epithelialization: None Tunneling: No Undermining: No Wound Description Classification: Partial Thickness Foul Odor Afte Wound Margin: Distinct, outline attached Slough/Fibrino Exudate Amount: Large Exudate Type: Purulent Exudate Color: yellow, brown, green r Cleansing: No Yes Wound Bed Granulation Amount: None Present (0%) Necrotic Amount: Large (67-100%) Necrotic Quality: Eschar,  Adherent Slough Periwound Skin Texture MAVERIC, DEBONO ([id number]) Texture Color No Abnormalities Noted: No No Abnormalities Noted: No Erythema: Yes Moisture Erythema Location: Circumferential No Abnormalities Noted: No Dry / Scaly: Yes Temperature / Pain Temperature: No Abnormality Tenderness on Palpation: Yes Wound Preparation Ulcer Cleansing: Rinsed/Irrigated with Saline Topical Anesthetic Applied: Other: lidocaine 4%, Treatment Notes Wound #1 (Left, Dorsal  Toe Great) 1. Cleansed with: Clean wound with Normal Saline 2. Anesthetic Topical Lidocaine 4% cream to wound bed prior to debridement 4. Dressing Applied: Xeroform 5. Secondary Dressing Applied Kerlix/Conform 7. Secured with Secretary/administrator) Signed: 12/27/2016 4:36:33 PM By: Alejandro Mulling Entered By: Alejandro Mulling on 12/27/2016 10:42:55 Jesse Mckenzie ([id number]) -------------------------------------------------------------------------------- Vitals Details Patient Name: Jesse Mckenzie Date of Service: 12/27/2016 10:30 AM Medical Record Number: [id number] Patient Account Number: [id number] Date of Birth/Sex: 1995-10-15 (21 y.o. Male) Treating RN: Ashok Cordia, Debi Primary Care Renner Sebald: Corky Downs Other Clinician: Referring Ennis Delpozo: Corky Downs Treating Bashar Milam/Extender: Maxwell Caul Weeks in Treatment: 0 Vital Signs Time Taken: 10:21 Temperature (F): 97.8 Height (in): 70 Pulse (bpm): 92 Source: Stated Respiratory Rate (breaths/min): 20 Weight (lbs): 192.2 Blood Pressure (mmHg): 109/73 Source: Measured Reference Range: 80 - 120 mg / dl Body Mass Index (BMI): 27.6 Electronic Signature(s) Signed: 12/27/2016 4:36:33 PM By: Alejandro Mulling Entered By: Alejandro Mulling on 12/27/2016 10:22:36

## 2017-01-03 ENCOUNTER — Encounter: Payer: Medicaid Other | Admitting: Internal Medicine

## 2017-01-03 DIAGNOSIS — L600 Ingrowing nail: Secondary | ICD-10-CM | POA: Diagnosis not present

## 2017-01-05 NOTE — Progress Notes (Signed)
BENETT, SWOYER ([id number]) Visit Report for 01/03/2017 Chief Complaint Document Details Patient Name: Jesse Mckenzie, Jesse Mckenzie Date of Service: 01/03/2017 1:30 PM Medical Record Number: [id number] Patient Account Number: [id number] Date of Birth/Sex: Nov 21, 1995 (20 y.o. Male) Treating RN: Phillis Haggis Primary Care Provider: Corky Downs Other Clinician: Referring Provider: Corky Downs Treating Provider/Extender: Maxwell Caul Weeks in Treatment: 1 Information Obtained from: Patient Chief Complaint Left 1st toe ingrown toenail Electronic Signature(s) Signed: 01/03/2017 5:59:51 PM By: Baltazar Najjar MD Entered By: Baltazar Najjar on 01/03/2017 17:14:03 Jesse Mckenzie ([id number]) -------------------------------------------------------------------------------- HPI Details Patient Name: Jesse Mckenzie Date of Service: 01/03/2017 1:30 PM Medical Record Number: [id number] Patient Account Number: [id number] Date of Birth/Sex: 12-01-1995 (20 y.o. Male) Treating RN: Phillis Haggis Primary Care Provider: Corky Downs Other Clinician: Referring Provider: Corky Downs Treating Provider/Extender: Maxwell Caul Weeks in Treatment: 1 History of Present Illness HPI Description: 12/27/16 patient presents today for evaluation concerning his left great toe where he has an ingrown toenail on the lateral aspect which has been giving him a lot of discomfort over the past 1-2 months. He has not been on antibiotics at this point he was evaluated by his primary care provider she did refer him to ask for further evaluation. He has also been referred to podiatry initially but due to issues with his Medicaid that appointment was initially canceled and rescheduled but will not be until the end of this month. Subsequently the appointment was made to see [place] which was able to be done sooner in the meantime. Pain has been intermittent mainly with walking he tells me that he was not really able to rate  the pain he just described it as a source/sharp sensation. Fortunately he has no erythema spreading up the toe no evidence of worsening cellulitis. 01/04/16; the patient arrives today with the left great toe causing no discomfort. He still has swollen soft tissue on the medial aspect of the toe which looks like a resolving paronychia however there is absolutely no tenderness here. I found this somewhat unusual. Per our intake nurse the area looks a lot better than last week Electronic Signature(s) Signed: 01/03/2017 5:59:51 PM By: Baltazar Najjar MD Entered By: Baltazar Najjar on 01/03/2017 17:14:57 Jesse Mckenzie ([id number]) -------------------------------------------------------------------------------- Physical Exam Details Patient Name: Jesse Mckenzie Date of Service: 01/03/2017 1:30 PM Medical Record Number: [id number] Patient Account Number: [id number] Date of Birth/Sex: [birth year] (20 y.o. Male) Treating RN: Phillis Haggis Primary Care Provider: Corky Downs Other Clinician: Referring Provider: Corky Downs Treating Provider/Extender: Maxwell Caul Weeks in Treatment: 1 Constitutional Sitting or standing Blood Pressure is within target range for patient.. Pulse regular and within target range for patient.Marland Kitchen Respirations regular, non-labored and within target range.. Temperature is normal and within the target range for the patient.Marland Kitchen appears in no distress. Cardiovascular Pedal pulses palpable and strong bilaterally.. Neurological Deep tendon reflexes Upper and Lower extremities symmetrical bilaterally.. Normal to pinprick, light touch and vibration. Notes Wound exam; still soft tissue swelling without pain no drainage. I found a complete absence of pain here somewhat un noted he had a pes planus deformity bilaterally which led me to believe that he might have some form of neuropathy However I could not prove that. Electronic Signature(s) Signed: 01/03/2017 5:59:51 PM  By: Baltazar Najjar MD Entered By: Baltazar Najjar on 01/03/2017 17:17:35 Jesse Mckenzie ([id number]) -------------------------------------------------------------------------------- Physician Orders Details Patient Name: Jesse Mckenzie Date of Service: 01/03/2017 1:30 PM Medical Record Number: [id number] Patient Account Number: [id number] Date of  Birth/Sex: 03/04/1996 (20 y.o. Male) Treating RN: Ashok CordiaPinkerton, Debi Primary Care Provider: Corky DownsMASOUD, JAVED Other Clinician: Referring Provider: Corky DownsMASOUD, JAVED Treating Provider/Extender: Altamese CarolinaOBSON, Maylee Bare G Weeks in Treatment: 1 Verbal / Phone Orders: Yes Clinician: Pinkerton, Debi Read Back and Verified: Yes Diagnosis Coding Wound Cleansing Wound #1 Left,Dorsal Toe Great o Clean wound with Normal Saline. o Cleanse wound with mild soap and water o May Shower, gently pat wound dry prior to applying new dressing. Anesthetic Wound #1 Left,Dorsal Toe Great o Topical Lidocaine 4% cream applied to wound bed prior to debridement o Other: - injected 2% lidocaine without epinephrine Primary Wound Dressing Wound #1 Left,Dorsal Toe Great o Other: - antibiotic ointment Secondary Dressing Wound #1 Left,Dorsal Toe Great o Dry Gauze o Conform/Kerlix Dressing Change Frequency Wound #1 Left,Dorsal Toe Great o Change dressing every day. Follow-up Appointments Wound #1 Left,Dorsal Toe Great o Return Appointment in 1 week. Edema Control Wound #1 Left,Dorsal Toe Great o Elevate legs to the level of the heart and pump ankles as often as possible Additional Orders / Instructions Wound #1 Left,Dorsal Toe Great o Increase protein intake. Jesse FreerGRIER, Sinan R. ([id number]) Electronic Signature(s) Signed: 01/03/2017 5:59:51 PM By: Baltazar Najjarobson, Iyanla Eilers MD Signed: 01/04/2017 5:03:57 PM By: Alejandro MullingPinkerton, Debra Entered By: Alejandro MullingPinkerton, Debra on 01/03/2017 13:56:00 Jesse FreerGRIER, Britt R.  ([id number]) -------------------------------------------------------------------------------- Problem List Details Patient Name: Jesse FreerGRIER, Noland R. Date of Service: 01/03/2017 1:30 PM Medical Record Number: [id number] Patient Account Number: [id number] Date of Birth/Sex: 01/02/1996 (20 y.o. Male) Treating RN: Phillis HaggisPinkerton, Debi Primary Care Provider: Corky DownsMASOUD, JAVED Other Clinician: Referring Provider: Corky DownsMASOUD, JAVED Treating Provider/Extender: Maxwell CaulOBSON, Aara Jacquot G Weeks in Treatment: 1 Active Problems ICD-10 Encounter Code Description Active Date Diagnosis L60.0 Ingrowing nail 12/27/2016 Yes L03.032 Cellulitis of left toe 12/27/2016 Yes F79 Unspecified intellectual disabilities 12/27/2016 Yes F42.9 Obsessive-compulsive disorder, unspecified 12/27/2016 Yes Inactive Problems Resolved Problems Electronic Signature(s) Signed: 01/03/2017 5:59:51 PM By: Baltazar Najjarobson, Candace Begue MD Entered By: Baltazar Najjarobson, Payden Docter on 01/03/2017 17:13:51 Jesse FreerGRIER, Ayoub R. ([id number]) -------------------------------------------------------------------------------- Progress Note Details Patient Name: Jesse FreerGRIER, Shawan R. Date of Service: 01/03/2017 1:30 PM Medical Record Number: [id number] Patient Account Number: [id number] Date of Birth/Sex: 01/15/1996 (20 y.o. Male) Treating RN: Phillis HaggisPinkerton, Debi Primary Care Provider: Corky DownsMASOUD, JAVED Other Clinician: Referring Provider: Corky DownsMASOUD, JAVED Treating Provider/Extender: Altamese CarolinaOBSON, Ia Leeb G Weeks in Treatment: 1 Subjective Chief Complaint Information obtained from Patient Left 1st toe ingrown toenail History of Present Illness (HPI) 12/27/16 patient presents today for evaluation concerning his left great toe where he has an ingrown toenail on the lateral aspect which has been giving him a lot of discomfort over the past 1-2 months. He has not been on antibiotics at this point he was evaluated by his primary care provider she did refer him to ask for further evaluation. He has also been referred to  podiatry initially but due to issues with his Medicaid that appointment was initially canceled and rescheduled but will not be until the end of this month. Subsequently the appointment was made to see us which was able to be done sooner in the meantime. Pain has been intermittent mainly with walking he tells me that he was not really able to rate the pain he just described it as a source/sharp sensation. Fortunately he has no erythema spreading up the toe no evidence of worsening cellulitis. 01/04/16; the patient arrives today with the left great toe causing no discomfort. He still has swollen soft tissue on the medial aspect of the toe which looks like a resolving paronychia however there is absolutely no tenderness here. I  found this somewhat unusual. Per our intake nurse the area looks a lot better than last week Objective Constitutional Sitting or standing Blood Pressure is within target range for patient.. Pulse regular and within target range for patient.Marland Kitchen Respirations regular, non-labored and within target range.. Temperature is normal and within the target range for the patient.Marland Kitchen appears in no distress. Vitals Time Taken: 1:26 PM, Height: 70 in, Weight: 192.2 lbs, BMI: 27.6, Temperature: 98.1 F, Pulse: 100 bpm, Respiratory Rate: 20 breaths/min, Blood Pressure: 135/84 mmHg. BRAYTON, BAUMGARTNER ([id number]) Cardiovascular Pedal pulses palpable and strong bilaterally.. Neurological Deep tendon reflexes Upper and Lower extremities symmetrical bilaterally.. Normal to pinprick, light touch and vibration. General Notes: Wound exam; still soft tissue swelling without pain no drainage. I found a complete absence of pain here somewhat un noted he had a pes planus deformity bilaterally which led me to believe that he might have some form of neuropathy However I could not prove that. Integumentary (Hair, Skin) Wound #1 status is Open. Original cause of wound was Gradually Appeared. The wound is  located on the [employer]. The wound measures 0.5cm length x 0.3cm width x 0.1cm depth; 0.118cm^2 area and 0.012cm^3 volume. There is no tunneling or undermining noted. There is a medium amount of serosanguineous drainage noted. The wound margin is distinct with the outline attached to the wound base. There is large (67-100%) red granulation within the wound bed. There is a small (1-33%) amount of necrotic tissue within the wound bed including Adherent Slough. The periwound skin appearance exhibited: Dry/Scaly, Erythema. The surrounding wound skin color is noted with erythema which is circumferential. Periwound temperature was noted as No Abnormality. The periwound has tenderness on palpation. Assessment Active Problems ICD-10 L60.0 - Ingrowing nail L03.032 - Cellulitis of left toe F79 - Unspecified intellectual disabilities F42.9 - Obsessive-compulsive disorder, unspecified Plan Wound Cleansing: Wound #1 Left,Dorsal Toe Great: Clean wound with Normal Saline. Cleanse wound with mild soap and water May Shower, gently pat wound dry prior to applying new dressing. Anesthetic: Wound #1 Left,Dorsal Toe Great: Topical Lidocaine 4% cream applied to wound bed prior to debridement CARY, LOTHROP ([id number]) Other: - injected 2% lidocaine without epinephrine Primary Wound Dressing: Wound #1 Left,Dorsal Toe Great: Other: - antibiotic ointment Secondary Dressing: Wound #1 Left,Dorsal Toe Great: Dry Gauze Conform/Kerlix Dressing Change Frequency: Wound #1 Left,Dorsal Toe Great: Change dressing every day. Follow-up Appointments: Wound #1 Left,Dorsal Toe Great: Return Appointment in 1 week. Edema Control: Wound #1 Left,Dorsal Toe Great: Elevate legs to the level of the heart and pump ankles as often as possible Additional Orders / Instructions: Wound #1 Left,Dorsal Toe Great: Increase protein intake. #1 there to clean the involved area daily with soap and water and apply  topical antibiotic such as Polysporin and bandage the area. #2 the patient complained of absolutely no pain Electronic Signature(s) Signed: 01/03/2017 5:59:51 PM By: Baltazar Najjar MD Entered By: Baltazar Najjar on 01/03/2017 17:18:23 Jesse Mckenzie ([id number]) -------------------------------------------------------------------------------- SuperBill Details Patient Name: Jesse Mckenzie Date of Service: 01/03/2017 Medical Record Number: [id number] Patient Account Number: [id number] Date of Birth/Sex: Nov 10, 1995 (20 y.o. Male) Treating RN: Phillis Haggis Primary Care Provider: Corky Downs Other Clinician: Referring Provider: Corky Downs Treating Provider/Extender: Maxwell Caul Weeks in Treatment: 1 Diagnosis Coding ICD-10 Codes Code Description L60.0 Ingrowing nail L03.032 Cellulitis of left toe F79 Unspecified intellectual disabilities F42.9 Obsessive-compulsive disorder, unspecified Facility Procedures CPT4 Code: [id number] Description: 99213 - WOUND CARE VISIT-LEV 3 EST PT Modifier: Quantity: 1 Physician Procedures  CPT4 Code: 9562130 Description: 99212 - WC PHYS LEVEL 2 - EST PT ICD-10 Description Diagnosis L03.032 Cellulitis of left toe L60.0 Ingrowing nail Modifier: Quantity: 1 Electronic Signature(s) Signed: 01/03/2017 5:59:51 PM By: Baltazar Najjar MD Signed: 01/04/2017 5:03:57 PM By: Alejandro Mulling Entered By: Alejandro Mulling on 01/03/2017 17:30:17

## 2017-01-06 NOTE — Progress Notes (Signed)
TYROME, DONATELLI ([id number]) Visit Report for 01/03/2017 Arrival Information Details Patient Name: Jesse Mckenzie, Jesse Mckenzie Date of Service: 01/03/2017 1:30 PM Medical Record Number: [id number] Patient Account Number: [id number] Date of Birth/Sex: 05/08/1996 (21 y.o. Male) Treating RN: Ashok Cordia, Debi Primary Care Neal Trulson: Corky Downs Other Clinician: Referring Rida Loudin: Corky Downs Treating Bedie Dominey/Extender: Altamese [place] in Treatment: 1 Visit Information History Since Last Visit All ordered tests and consults were completed: No Patient Arrived: Ambulatory Added or deleted any medications: No Arrival Time: 13:18 Any new allergies or adverse reactions: No Accompanied By: caregiver Had a fall or experienced change in No Transfer Assistance: None activities of daily living that may affect Patient Requires Transmission-Based No risk of falls: Precautions: Signs or symptoms of abuse/neglect since last No Patient Has Alerts: No visito Hospitalized since last visit: No Has Dressing in Place as Prescribed: Yes Pain Present Now: No Electronic Signature(s) Signed: 01/04/2017 5:03:57 PM By: Alejandro Mulling Entered By: Alejandro Mulling on 01/03/2017 13:25:53 Jesse Mckenzie ([id number]) -------------------------------------------------------------------------------- Clinic Level of Care Assessment Details Patient Name: Jesse Mckenzie Date of Service: 01/03/2017 1:30 PM Medical Record Number: [id number] Patient Account Number: [id number] Date of Birth/Sex: 11-26-1995 (21 y.o. Male) Treating RN: Ashok Cordia, Debi Primary Care Bernis Stecher: Corky Downs Other Clinician: Referring Forbes Loll: Corky Downs Treating Maki Sweetser/Extender: Altamese [place] in Treatment: 1 Clinic Level of Care Assessment Items TOOL 4 Quantity Score X - Use when only an EandM is performed on FOLLOW-UP visit 1 0 ASSESSMENTS - Nursing Assessment / Reassessment X - Reassessment of Co-morbidities  (includes updates in patient status) 1 10 X - Reassessment of Adherence to Treatment Plan 1 5 ASSESSMENTS - Wound and Skin Assessment / Reassessment X - Simple Wound Assessment / Reassessment - one wound 1 5 []  - Complex Wound Assessment / Reassessment - multiple wounds 0 []  - Dermatologic / Skin Assessment (not related to wound area) 0 ASSESSMENTS - Focused Assessment []  - Circumferential Edema Measurements - multi extremities 0 []  - Nutritional Assessment / Counseling / Intervention 0 []  - Lower Extremity Assessment (monofilament, tuning fork, pulses) 0 []  - Peripheral Arterial Disease Assessment (using hand held doppler) 0 ASSESSMENTS - Ostomy and/or Continence Assessment and Care []  - Incontinence Assessment and Management 0 []  - Ostomy Care Assessment and Management (repouching, etc.) 0 PROCESS - Coordination of Care []  - Simple Patient / Family Education for ongoing care 0 X - Complex (extensive) Patient / Family Education for ongoing care 1 20 X - Staff obtains Chiropractor, Records, Test Results / Process Orders 1 10 []  - Staff telephones HHA, Nursing Homes / Clarify orders / etc 0 []  - Routine Transfer to another Facility (non-emergent condition) 0 LUAY, BALDING ([id number]) []  - Routine Hospital Admission (non-emergent condition) 0 []  - New Admissions / Manufacturing engineer / Ordering NPWT, Apligraf, etc. 0 []  - Emergency Hospital Admission (emergent condition) 0 X - Simple Discharge Coordination 1 10 []  - Complex (extensive) Discharge Coordination 0 PROCESS - Special Needs []  - Pediatric / Minor Patient Management 0 []  - Isolation Patient Management 0 []  - Hearing / Language / Visual special needs 0 []  - Assessment of Community assistance (transportation, D/C planning, etc.) 0 []  - Additional assistance / Altered mentation 0 []  - Support Surface(s) Assessment (bed, cushion, seat, etc.) 0 INTERVENTIONS - Wound Cleansing / Measurement X - Simple Wound Cleansing - one  wound 1 5 []  - Complex Wound Cleansing - multiple wounds 0 X - Wound Imaging (photographs - any number of wounds) 1 5 []  -  Wound Tracing (instead of photographs) 0 X - Simple Wound Measurement - one wound 1 5 []  - Complex Wound Measurement - multiple wounds 0 INTERVENTIONS - Wound Dressings X - Small Wound Dressing one or multiple wounds 1 10 []  - Medium Wound Dressing one or multiple wounds 0 []  - Large Wound Dressing one or multiple wounds 0 X - Application of Medications - topical 1 5 []  - Application of Medications - injection 0 INTERVENTIONS - Miscellaneous []  - External ear exam 0 Jesse Mckenzie, Brainard R. ([id number]) []  - Specimen Collection (cultures, biopsies, blood, body fluids, etc.) 0 []  - Specimen(s) / Culture(s) sent or taken to Lab for analysis 0 []  - Patient Transfer (multiple staff / Michiel SitesHoyer Lift / Similar devices) 0 []  - Simple Staple / Suture removal (25 or less) 0 []  - Complex Staple / Suture removal (26 or more) 0 []  - Hypo / Hyperglycemic Management (close monitor of Blood Glucose) 0 []  - Ankle / Brachial Index (ABI) - do not check if billed separately 0 X - Vital Signs 1 5 Has the patient been seen at the hospital within the last three years: Yes Total Score: 95 Level Of Care: New/Established - Level 3 Electronic Signature(s) Signed: 01/04/2017 5:03:57 PM By: Alejandro MullingPinkerton, Debra Entered By: Alejandro MullingPinkerton, Debra on 01/03/2017 17:30:10 Jesse Mckenzie, Sarim R. ([id number]) -------------------------------------------------------------------------------- Encounter Discharge Information Details Patient Name: Jesse Mckenzie, Jesse R. Date of Service: 01/03/2017 1:30 PM Medical Record Number: [id number] Patient Account Number: [id number] Date of Birth/Sex: 05/30/1996 (21 y.o. Male) Treating RN: Phillis HaggisPinkerton, Debi Primary Care Letti Towell: Corky DownsMASOUD, JAVED Other Clinician: Referring Kilah Drahos: Corky DownsMASOUD, JAVED Treating Sihaam Chrobak/Extender: Altamese CarolinaOBSON, MICHAEL G Weeks in Treatment: 1 Encounter Discharge Information  Items Discharge Pain Level: 0 Discharge Condition: Stable Ambulatory Status: Ambulatory Discharge Destination: Home Transportation: Private Auto Accompanied By: caregiver Schedule Follow-up Appointment: Yes Medication Reconciliation completed and provided to Patient/Care No Andri Prestia: Provided on Clinical Summary of Care: 01/03/2017 Form Type Recipient Paper Patient AG Electronic Signature(s) Signed: 01/03/2017 2:06:21 PM By: Gwenlyn PerkingMoore, Shelia Entered By: Gwenlyn PerkingMoore, Shelia on 01/03/2017 14:06:20 Jesse Mckenzie, Jaden R. ([id number]) -------------------------------------------------------------------------------- Lower Extremity Assessment Details Patient Name: Jesse Mckenzie, Jonavan R. Date of Service: 01/03/2017 1:30 PM Medical Record Number: [id number] Patient Account Number: [id number] Date of Birth/Sex: 11/14/1995 (21 y.o. Male) Treating RN: Phillis HaggisPinkerton, Debi Primary Care Ronnisha Felber: Corky DownsMASOUD, JAVED Other Clinician: Referring Melford Tullier: Corky DownsMASOUD, JAVED Treating Montavious Wierzba/Extender: Maxwell CaulOBSON, MICHAEL G Weeks in Treatment: 1 Vascular Assessment Pulses: Dorsalis Pedis Palpable: [Left:Yes] Posterior Tibial Extremity colors, hair growth, and conditions: Extremity Color: [Left:Normal] Temperature of Extremity: [Left:Warm] Capillary Refill: [Left:< 3 seconds] Electronic Signature(s) Signed: 01/04/2017 5:03:57 PM By: Alejandro MullingPinkerton, Debra Entered By: Alejandro MullingPinkerton, Debra on 01/03/2017 13:32:55 Jesse Mckenzie, Macdonald R. ([id number]) -------------------------------------------------------------------------------- Multi Wound Chart Details Patient Name: Jesse Mckenzie, Erbie R. Date of Service: 01/03/2017 1:30 PM Medical Record Number: [id number] Patient Account Number: [id number] Date of Birth/Sex: 02/07/1996 (21 y.o. Male) Treating RN: Phillis HaggisPinkerton, Debi Primary Care Montrez Marietta: Corky DownsMASOUD, JAVED Other Clinician: Referring Remmy Crass: Corky DownsMASOUD, JAVED Treating Azalya Galyon/Extender: Maxwell CaulOBSON, MICHAEL G Weeks in Treatment: 1 Vital Signs Height(in):  70 Pulse(bpm): 100 Weight(lbs): 192.2 Blood Pressure 135/84 (mmHg): Body Mass Index(BMI): 28 Temperature(F): 98.1 Respiratory Rate 20 (breaths/min): Photos: [1:No Photos] [N/A:N/A] Wound Location: [1:Left Toe Great - Dorsal] [N/A:N/A] Wounding Event: [1:Gradually Appeared] [N/A:N/A] Primary Etiology: [1:To be determined] [N/A:N/A] Date Acquired: [1:11/27/2016] [N/A:N/A] Weeks of Treatment: [1:1] [N/A:N/A] Wound Status: [1:Open] [N/A:N/A] Measurements L x W x D 0.5x0.3x0.1 [N/A:N/A] (cm) Area (cm) : [1:0.118] [N/A:N/A] Volume (cm) : [1:0.012] [N/A:N/A] % Reduction in Area: [1:44.30%] [N/A:N/A] % Reduction in Volume: 42.90% [N/A:N/A] Classification: [1:Partial  Thickness] [N/A:N/A] Exudate Amount: [1:Medium] [N/A:N/A] Exudate Type: [1:Serosanguineous] [N/A:N/A] Exudate Color: [1:red, brown] [N/A:N/A] Wound Margin: [1:Distinct, outline attached] [N/A:N/A] Granulation Amount: [1:Large (67-100%)] [N/A:N/A] Granulation Quality: [1:Red] [N/A:N/A] Necrotic Amount: [1:Small (1-33%)] [N/A:N/A] Epithelialization: [1:None] [N/A:N/A] Periwound Skin Texture: No Abnormalities Noted [N/A:N/A] Periwound Skin [1:Dry/Scaly: Yes] [N/A:N/A] Moisture: Periwound Skin Color: Erythema: Yes [N/A:N/A] Erythema Location: [1:Circumferential] [N/A:N/A] Temperature: [1:No Abnormality Yes] [N/A:N/A N/A] Tenderness on Palpation: Wound Preparation: Ulcer Cleansing: N/A N/A Rinsed/Irrigated with Saline Topical Anesthetic Applied: Other: lidocaine 4% Treatment Notes Wound #1 (Left, Dorsal Toe Great) 1. Cleansed with: Clean wound with Normal Saline 2. Anesthetic Topical Lidocaine 4% cream to wound bed prior to debridement 4. Dressing Applied: Other dressing (specify in notes) 5. Secondary Dressing Applied Dry Gauze Kerlix/Conform 7. Secured with Tape Notes antibiotic ointment Electronic Signature(s) Signed: 01/03/2017 5:59:51 PM By: Baltazar Najjar MD Entered By: Baltazar Najjar on  01/03/2017 17:13:56 Jesse Mckenzie ([id number]) -------------------------------------------------------------------------------- Multi-Disciplinary Care Plan Details Patient Name: Jesse Mckenzie Date of Service: 01/03/2017 1:30 PM Medical Record Number: [id number] Patient Account Number: [id number] Date of Birth/Sex: 1995/09/06 (21 y.o. Male) Treating RN: Phillis Haggis Primary Care Neil Errickson: Corky Downs Other Clinician: Referring Terrace Chiem: Corky Downs Treating Natika Geyer/Extender: Altamese [place] in Treatment: 1 Active Inactive ` Orientation to the Wound Care Program Nursing Diagnoses: Knowledge deficit related to the wound healing center program Goals: Patient/caregiver will verbalize understanding of the Wound Healing Center Program Date Initiated: 12/27/2016 Target Resolution Date: 01/21/2017 Goal Status: Active Interventions: Provide education on orientation to the wound center Notes: ` Pain, Acute or Chronic Nursing Diagnoses: Pain, acute or chronic: actual or potential Potential alteration in comfort, pain Goals: Patient/caregiver will verbalize adequate pain control between visits Date Initiated: 12/27/2016 Target Resolution Date: 04/29/2017 Goal Status: Active Interventions: Complete pain assessment as per visit requirements Notes: Electronic Signature(s) Signed: 01/04/2017 5:03:57 PM By: Alejandro Mulling Entered By: Alejandro Mulling on 01/03/2017 13:34:20 Jesse Mckenzie ([id number] Mckenzie ([id number]) -------------------------------------------------------------------------------- Pain Assessment Details Patient Name: Jesse Mckenzie Date of Service: 01/03/2017 1:30 PM Medical Record Number: [id number] Patient Account Number: [id number] Date of Birth/Sex: 09-29-1995 (21 y.o. Male) Treating RN: Phillis Haggis Primary Care Darlinda Bellows: Corky Downs Other Clinician: Referring [place] Franson: Corky Downs Treating Jesi Jurgens/Extender: Maxwell Caul Weeks in Treatment: 1 Active Problems Location of Pain Severity and Description of Pain Patient Has Paino No Site Locations With Dressing Change: No Pain Management and Medication Current Pain Management: Electronic Signature(s) Signed: 01/04/2017 5:03:57 PM By: Alejandro Mulling Entered By: Alejandro Mulling on 01/03/2017 13:25:59 Jesse Mckenzie ([id number]) -------------------------------------------------------------------------------- Patient/Caregiver Education Details Patient Name: Jesse Mckenzie Date of Service: 01/03/2017 1:30 PM Medical Record Number: [id number] Patient Account Number: [id number] Date of Birth/Gender: 1995/12/12 (21 y.o. Male) Treating RN: Phillis Haggis Primary Care Physician: Corky Downs Other Clinician: Referring Physician: Corky Downs Treating Physician/Extender: Altamese [place] in Treatment: 1 Education Assessment Education Provided To: Patient Education Topics Provided Wound/Skin Impairment: Handouts: Other: change dressing as ordered Methods: Demonstration, Explain/Verbal Responses: State content correctly Electronic Signature(s) Signed: 01/04/2017 5:03:57 PM By: Alejandro Mulling Entered By: Alejandro Mulling on 01/03/2017 13:57:27 Jesse Mckenzie ([id number]) -------------------------------------------------------------------------------- Wound Assessment Details Patient Name: Jesse Mckenzie Date of Service: 01/03/2017 1:30 PM Medical Record Number: [id number] Patient Account Number: [id number] Date of Birth/Sex: [birth year] (21 y.o. Male) Treating RN: Ashok Cordia, Debi Primary Care Saria Haran: Corky Downs Other Clinician: Referring Jakota Manthei: Corky Downs Treating Rafiq Bucklin/Extender: Maxwell Caul Weeks in Treatment: 1 Wound Status Wound Number: 1 Primary Etiology: To be determined Wound Location:  Left Toe Great - Dorsal Wound Status: Open Wounding Event: Gradually Appeared Date Acquired: 11/27/2016 Weeks Of  Treatment: 1 Clustered Wound: No Photos Photo Uploaded By: Alejandro Mulling on 01/03/2017 17:23:27 Wound Measurements Length: (cm) 0.5 Width: (cm) 0.3 Depth: (cm) 0.1 Area: (cm) 0.118 Volume: (cm) 0.012 % Reduction in Area: 44.3% % Reduction in Volume: 42.9% Epithelialization: None Tunneling: No Undermining: No Wound Description Classification: Partial Thickness Foul Odor A Wound Margin: Distinct, outline attached Slough/Fibr Exudate Amount: Medium Exudate Type: Serosanguineous Exudate Color: red, brown fter Cleansing: No ino Yes Wound Bed Granulation Amount: Large (67-100%) Granulation Quality: Red Necrotic Amount: Small (1-33%) Necrotic Quality: [address] [place] R. ([id number]) Periwound Skin Texture Texture Color No Abnormalities Noted: No No Abnormalities Noted: No Erythema: Yes Moisture Erythema Location: Circumferential No Abnormalities Noted: No Dry / Scaly: Yes Temperature / Pain Temperature: No Abnormality Tenderness on Palpation: Yes Wound Preparation Ulcer Cleansing: Rinsed/Irrigated with Saline Topical Anesthetic Applied: Other: lidocaine 4%, Treatment Notes Wound #1 (Left, Dorsal Toe Great) 1. Cleansed with: Clean wound with Normal Saline 2. Anesthetic Topical Lidocaine 4% cream to wound bed prior to debridement 4. Dressing Applied: Other dressing (specify in notes) 5. Secondary Dressing Applied Dry Gauze Kerlix/Conform 7. Secured with Tape Notes antibiotic ointment Electronic Signature(s) Signed: 01/04/2017 5:03:57 PM By: Alejandro Mulling Entered By: Alejandro Mulling on 01/03/2017 13:32:10 Jesse Mckenzie ([id number]) -------------------------------------------------------------------------------- Vitals Details Patient Name: Jesse Mckenzie Date of Service: 01/03/2017 1:30 PM Medical Record Number: [id number] Patient Account Number: [id number] Date of Birth/Sex: Jun 22, 1995 (21 y.o. Male) Treating RN: Ashok Cordia,  Debi Primary Care Janeane Cozart: Corky Downs Other Clinician: Referring Danielys Madry: Corky Downs Treating Jeren Dufrane/Extender: Maxwell Caul Weeks in Treatment: 1 Vital Signs Time Taken: 13:26 Temperature (F): 98.1 Height (in): 70 Pulse (bpm): 100 Weight (lbs): 192.2 Respiratory Rate (breaths/min): 20 Body Mass Index (BMI): 27.6 Blood Pressure (mmHg): 135/84 Reference Range: 80 - 120 mg / dl Electronic Signature(s) Signed: 01/04/2017 5:03:57 PM By: Alejandro Mulling Entered By: Alejandro Mulling on 01/03/2017 13:27:30

## 2017-01-09 ENCOUNTER — Ambulatory Visit: Payer: Self-pay | Admitting: Podiatry

## 2017-01-10 ENCOUNTER — Ambulatory Visit: Payer: Medicaid Other | Admitting: Internal Medicine

## 2017-01-17 ENCOUNTER — Encounter: Payer: Medicaid Other | Admitting: Internal Medicine

## 2017-01-17 DIAGNOSIS — L600 Ingrowing nail: Secondary | ICD-10-CM | POA: Diagnosis not present

## 2017-01-18 NOTE — Progress Notes (Signed)
Jesse Mckenzie, Eduin R. ([id number]) Visit Report for 01/17/2017 Arrival Information Details Patient Name: Jesse Mckenzie, Jesse R. Date of Service: 01/17/2017 8:45 AM Medical Record Number: [id number] Patient Account Number: [id number] Date of Birth/Sex: 05/29/1996 (21 y.o. Male) Treating RN: Phillis HaggisPinkerton, Debi Primary Care Bashir Marchetti: Corky DownsMASOUD, JAVED Other Clinician: Referring Ceylon Arenson: Corky DownsMASOUD, JAVED Treating Vannesa Abair/Extender: Altamese CarolinaOBSON, MICHAEL G Weeks in Treatment: 3 Visit Information History Since Last Visit All ordered tests and consults were completed: No Patient Arrived: Ambulatory Added or deleted any medications: No Arrival Time: 08:41 Any new allergies or adverse reactions: No Accompanied By: caregiver Had a fall or experienced change in No Transfer Assistance: None activities of daily living that may affect Patient Identification Verified: Yes risk of falls: Secondary Verification Process Yes Signs or symptoms of abuse/neglect since last No Completed: visito Patient Requires Transmission-Based No Hospitalized since last visit: No Precautions: Pain Present Now: No Patient Has Alerts: No Electronic Signature(s) Signed: 01/17/2017 5:31:33 PM By: Alejandro MullingPinkerton, Debra Entered By: Alejandro MullingPinkerton, Debra on 01/17/2017 08:43:28 Jesse Mckenzie, Jesse R. ([id number]) -------------------------------------------------------------------------------- Clinic Level of Care Assessment Details Patient Name: Jesse Mckenzie, Jesse R. Date of Service: 01/17/2017 8:45 AM Medical Record Number: [id number] Patient Account Number: [id number] Date of Birth/Sex: 08/10/1995 (21 y.o. Male) Treating RN: Ashok CordiaPinkerton, Debi Primary Care Julionna Marczak: Corky DownsMASOUD, JAVED Other Clinician: Referring Boyd Buffalo: Corky DownsMASOUD, JAVED Treating Jacobi Ryant/Extender: Altamese CarolinaOBSON, MICHAEL G Weeks in Treatment: 3 Clinic Level of Care Assessment Items TOOL 4 Quantity Score X - Use when only an EandM is performed on FOLLOW-UP visit 1 0 ASSESSMENTS - Nursing Assessment /  Reassessment X - Reassessment of Co-morbidities (includes updates in patient status) 1 10 X - Reassessment of Adherence to Treatment Plan 1 5 ASSESSMENTS - Wound and Skin Assessment / Reassessment X - Simple Wound Assessment / Reassessment - one wound 1 5 []  - Complex Wound Assessment / Reassessment - multiple wounds 0 []  - Dermatologic / Skin Assessment (not related to wound area) 0 ASSESSMENTS - Focused Assessment []  - Circumferential Edema Measurements - multi extremities 0 []  - Nutritional Assessment / Counseling / Intervention 0 []  - Lower Extremity Assessment (monofilament, tuning fork, pulses) 0 []  - Peripheral Arterial Disease Assessment (using hand held doppler) 0 ASSESSMENTS - Ostomy and/or Continence Assessment and Care []  - Incontinence Assessment and Management 0 []  - Ostomy Care Assessment and Management (repouching, etc.) 0 PROCESS - Coordination of Care X - Simple Patient / Family Education for ongoing care 1 15 []  - Complex (extensive) Patient / Family Education for ongoing care 0 []  - Staff obtains ChiropractorConsents, Records, Test Results / Process Orders 0 []  - Staff telephones HHA, Nursing Homes / Clarify orders / etc 0 []  - Routine Transfer to another Facility (non-emergent condition) 0 Jesse Mckenzie, Jesse R. ([id number]) []  - Routine Hospital Admission (non-emergent condition) 0 []  - New Admissions / Manufacturing engineernsurance Authorizations / Ordering NPWT, Apligraf, etc. 0 []  - Emergency Hospital Admission (emergent condition) 0 X - Simple Discharge Coordination 1 10 []  - Complex (extensive) Discharge Coordination 0 PROCESS - Special Needs []  - Pediatric / Minor Patient Management 0 []  - Isolation Patient Management 0 []  - Hearing / Language / Visual special needs 0 []  - Assessment of Community assistance (transportation, D/C planning, etc.) 0 []  - Additional assistance / Altered mentation 0 []  - Support Surface(s) Assessment (bed, cushion, seat, etc.) 0 INTERVENTIONS - Wound Cleansing /  Measurement X - Simple Wound Cleansing - one wound 1 5 []  - Complex Wound Cleansing - multiple wounds 0 X - Wound Imaging (photographs - any number of wounds) 1  5 []  - Wound Tracing (instead of photographs) 0 []  - Simple Wound Measurement - one wound 0 []  - Complex Wound Measurement - multiple wounds 0 INTERVENTIONS - Wound Dressings []  - Small Wound Dressing one or multiple wounds 0 []  - Medium Wound Dressing one or multiple wounds 0 []  - Large Wound Dressing one or multiple wounds 0 []  - Application of Medications - topical 0 []  - Application of Medications - injection 0 INTERVENTIONS - Miscellaneous []  - External ear exam 0 Jesse Mckenzie, Jesse R. ([id number]) []  - Specimen Collection (cultures, biopsies, blood, body fluids, etc.) 0 []  - Specimen(s) / Culture(s) sent or taken to Lab for analysis 0 []  - Patient Transfer (multiple staff / Michiel SitesHoyer Lift / Similar devices) 0 []  - Simple Staple / Suture removal (25 or less) 0 []  - Complex Staple / Suture removal (26 or more) 0 []  - Hypo / Hyperglycemic Management (close monitor of Blood Glucose) 0 []  - Ankle / Brachial Index (ABI) - do not check if billed separately 0 X - Vital Signs 1 5 Has the patient been seen at the hospital within the last three years: Yes Total Score: 60 Level Of Care: New/Established - Level 2 Electronic Signature(s) Signed: 01/17/2017 5:31:33 PM By: Alejandro MullingPinkerton, Debra Entered By: Alejandro MullingPinkerton, Debra on 01/17/2017 09:01:48 Jesse Mckenzie, Jesse R. ([id number]) -------------------------------------------------------------------------------- Encounter Discharge Information Details Patient Name: Jesse Mckenzie, Jesse R. Date of Service: 01/17/2017 8:45 AM Medical Record Number: [id number] Patient Account Number: [id number] Date of Birth/Sex: 03/18/1996 (21 y.o. Male) Treating RN: Phillis HaggisPinkerton, Debi Primary Care Sevannah Madia: Corky DownsMASOUD, JAVED Other Clinician: Referring Pascha Fogal: Corky DownsMASOUD, JAVED Treating Dorella Laster/Extender: Altamese CarolinaOBSON, MICHAEL G Weeks in  Treatment: 3 Encounter Discharge Information Items Discharge Pain Level: 0 Discharge Condition: Stable Ambulatory Status: Ambulatory Other (Note Discharge Destination: Required) Transportation: Private Auto Accompanied By: caregiver Schedule Follow-up Appointment: No Medication Reconciliation completed and provided to Patient/Care No Rashawna Scoles: Provided on Clinical Summary of Care: 01/17/2017 Form Type Recipient Paper Patient AG Electronic Signature(s) Signed: 01/17/2017 8:56:26 AM By: Gwenlyn PerkingMoore, Shelia Entered By: Gwenlyn PerkingMoore, Shelia on 01/17/2017 08:56:25 Jesse Mckenzie, Thomos R. ([id number]) -------------------------------------------------------------------------------- Lower Extremity Assessment Details Patient Name: Jesse Mckenzie, Jesse R. Date of Service: 01/17/2017 8:45 AM Medical Record Number: [id number] Patient Account Number: [id number] Date of Birth/Sex: 02/05/1996 (21 y.o. Male) Treating RN: Phillis HaggisPinkerton, Debi Primary Care Dashae Wilcher: Corky DownsMASOUD, JAVED Other Clinician: Referring Ramel Tobon: Corky DownsMASOUD, JAVED Treating Bear Osten/Extender: Maxwell CaulOBSON, MICHAEL G Weeks in Treatment: 3 Vascular Assessment Pulses: Dorsalis Pedis Palpable: [Left:Yes] Posterior Tibial Extremity colors, hair growth, and conditions: Extremity Color: [Left:Normal] Temperature of Extremity: [Left:Warm] Capillary Refill: [Left:< 3 seconds] Toe Nail Assessment Left: Right: Thick: No Discolored: No Deformed: No Improper Length and Hygiene: Yes Electronic Signature(s) Signed: 01/17/2017 5:31:33 PM By: Alejandro MullingPinkerton, Debra Entered By: Alejandro MullingPinkerton, Debra on 01/17/2017 08:52:02 Jesse Mckenzie, Osmar R. ([id number]) -------------------------------------------------------------------------------- Multi Wound Chart Details Patient Name: Jesse Mckenzie, Jesse R. Date of Service: 01/17/2017 8:45 AM Medical Record Number: [id number] Patient Account Number: [id number] Date of Birth/Sex: 03/18/1996 (21 y.o. Male) Treating RN: Phillis HaggisPinkerton, Debi Primary Care  Takiyah Bohnsack: Corky DownsMASOUD, JAVED Other Clinician: Referring Wilfrido Luedke: Corky DownsMASOUD, JAVED Treating Allan Bacigalupi/Extender: Maxwell CaulOBSON, MICHAEL G Weeks in Treatment: 3 Vital Signs Height(in): 70 Pulse(bpm): 91 Weight(lbs): 192.2 Blood Pressure 122/79 (mmHg): Body Mass Index(BMI): 28 Temperature(F): 97.8 Respiratory Rate 20 (breaths/min): Photos: [1:No Photos] [N/A:N/A] Wound Location: [1:Left Toe Great - Dorsal] [N/A:N/A] Wounding Event: [1:Gradually Appeared] [N/A:N/A] Primary Etiology: [1:Atypical] [N/A:N/A] Date Acquired: [1:11/27/2016] [N/A:N/A] Weeks of Treatment: [1:3] [N/A:N/A] Wound Status: [1:Open] [N/A:N/A] Measurements L x W x D 0x0x0 [N/A:N/A] (cm) Area (cm) : [1:0] [N/A:N/A] Volume (cm) : [  1:0] [N/A:N/A] % Reduction in Area: [1:100.00%] [N/A:N/A] % Reduction in Volume: 100.00% [N/A:N/A] Classification: [1:Partial Thickness] [N/A:N/A] Exudate Amount: [1:None Present] [N/A:N/A] Wound Margin: [1:Distinct, outline attached] [N/A:N/A] Granulation Amount: [1:None Present (0%)] [N/A:N/A] Necrotic Amount: [1:None Present (0%)] [N/A:N/A] Epithelialization: [1:None] [N/A:N/A] Periwound Skin Texture: No Abnormalities Noted [N/A:N/A] Periwound Skin [1:Dry/Scaly: No] [N/A:N/A] Moisture: Periwound Skin Color: Erythema: Yes [N/A:N/A] Erythema Location: [1:Circumferential] [N/A:N/A] Temperature: [1:No Abnormality] [N/A:N/A] Tenderness on [1:Yes] [N/A:N/A] Palpation: Wound Preparation: [1:Ulcer Cleansing: Rinsed/Irrigated with] [N/A:N/A] Saline Topical Anesthetic Applied: None Treatment Notes Electronic Signature(s) Signed: 01/17/2017 6:05:23 PM By: Baltazar Najjar MD Entered By: Baltazar Najjar on 01/17/2017 08:56:52 Jesse [name] ([id number]) -------------------------------------------------------------------------------- Multi-Disciplinary Care Plan Details Patient Name: Jesse [name] Date of Service: 01/17/2017 8:45 AM Medical Record Number: [id number] Patient Account  Number: [id number] Date of Birth/Sex: Mar 10, 1996 (21 y.o. Male) Treating RN: Phillis Haggis Primary Care Braelyn Jenson: Corky Downs Other Clinician: Referring Henri Guedes: Corky Downs Treating Arlisa Leclere/Extender: Maxwell Caul Weeks in Treatment: 3 Active Inactive Electronic Signature(s) Signed: 01/17/2017 5:31:33 PM By: Alejandro Mulling Entered By: Alejandro Mulling on 01/17/2017 08:52:12 Jesse [name] ([id number]) -------------------------------------------------------------------------------- Pain Assessment Details Patient Name: Jesse [name] Date of Service: 01/17/2017 8:45 AM Medical Record Number: [id number] Patient Account Number: [id number] Date of Birth/Sex: 1995-11-15 (21 y.o. Male) Treating RN: Phillis Haggis Primary Care Clarence Dunsmore: Corky Downs Other Clinician: Referring Hilde Churchman: Corky Downs Treating Donold Marotto/Extender: Maxwell Caul Weeks in Treatment: 3 Active Problems Location of Pain Severity and Description of Pain Patient Has Paino No Site Locations With Dressing Change: No Pain Management and Medication Current Pain Management: Electronic Signature(s) Signed: 01/17/2017 5:31:33 PM By: Alejandro Mulling Entered By: Alejandro Mulling on 01/17/2017 08:43:34 Jesse [name] ([id number]) -------------------------------------------------------------------------------- Patient/Caregiver Education Details Patient Name: Jesse [name] Date of Service: 01/17/2017 8:45 AM Medical Record Number: [id number] Patient Account Number: [id number] Date of Birth/Gender: 26-Jun-1995 (21 y.o. Male) Treating RN: Phillis Haggis Primary Care Physician: Corky Downs Other Clinician: Referring Physician: Corky Downs Treating Physician/Extender: Altamese [place] in Treatment: 3 Education Assessment Education Provided To: Patient and Caregiver Education Topics Provided Wound/Skin Impairment: Handouts: Other: Please call our office if you have any questions or  concerns. Electronic Signature(s) Signed: 01/17/2017 5:31:33 PM By: Alejandro Mulling Entered By: Alejandro Mulling on 01/17/2017 08:54:01 Jesse [name] ([id number]) -------------------------------------------------------------------------------- Wound Assessment Details Patient Name: Jesse [name] Date of Service: 01/17/2017 8:45 AM Medical Record Number: [id number] Patient Account Number: [id number] Date of Birth/Sex: 1995/12/31 (21 y.o. Male) Treating RN: Ashok Cordia, Debi Primary Care Aymar Whitfill: Corky Downs Other Clinician: Referring Sinia Antosh: Corky Downs Treating Kaydence Menard/Extender: Maxwell Caul Weeks in Treatment: 3 Wound Status Wound Number: 1 Primary Etiology: Atypical Wound Location: Left Toe Great - Dorsal Wound Status: Open Wounding Event: Gradually Appeared Date Acquired: 11/27/2016 Weeks Of Treatment: 3 Clustered Wound: No Photos Photo Uploaded By: Alejandro Mulling on 01/17/2017 09:04:06 Wound Measurements Length: (cm) 0 % Reduct Width: (cm) 0 % Reduct Depth: (cm) 0 Epitheli Area: (cm) 0 Tunneli Volume: (cm) 0 Undermi ion in Area: 100% ion in Volume: 100% alization: None ng: No ning: No Wound Description Classification: Partial Thickness Wound Margin: Distinct, outline attached Exudate Amount: None Present Foul Odor After Cleansing: No Slough/Fibrino No Wound Bed Granulation Amount: None Present (0%) Necrotic Amount: None Present (0%) Periwound Skin Texture Texture Color No Abnormalities Noted: No No Abnormalities Noted: No Erythema: Yes Moisture STCLAIR, SZYMBORSKI R. ([id number]) No Abnormalities Noted: No Erythema Location: Circumferential Dry / Scaly: No Temperature / Pain Temperature: No Abnormality Tenderness on Palpation: Yes Wound Preparation  Ulcer Cleansing: Rinsed/Irrigated with Saline Topical Anesthetic Applied: None Electronic Signature(s) Signed: 01/17/2017 5:31:33 PM By: Alejandro Mulling Entered By: Alejandro Mulling on  01/17/2017 08:51:35 Jesse [name] ([id number]) -------------------------------------------------------------------------------- Vitals Details Patient Name: Jesse [name] Date of Service: 01/17/2017 8:45 AM Medical Record Number: [id number] Patient Account Number: [id number] Date of Birth/Sex: 04-May-1996 (21 y.o. Male) Treating RN: Ashok Cordia, Debi Primary Care Haly Feher: Corky Downs Other Clinician: Referring Jianni Shelden: Corky Downs Treating Dorthea Maina/Extender: Maxwell Caul Weeks in Treatment: 3 Vital Signs Time Taken: 08:43 Temperature (F): 97.8 Height (in): 70 Pulse (bpm): 91 Weight (lbs): 192.2 Respiratory Rate (breaths/min): 20 Body Mass Index (BMI): 27.6 Blood Pressure (mmHg): 122/79 Reference Range: 80 - 120 mg / dl Electronic Signature(s) Signed: 01/17/2017 5:31:33 PM By: Alejandro Mulling Entered By: Alejandro Mulling on 01/17/2017 08:43:51

## 2017-01-18 NOTE — Progress Notes (Signed)
Jesse Mckenzie ([id number]) Visit Report for 01/17/2017 HPI Details Patient Name: Jesse Mckenzie, Jesse Mckenzie Date of Service: 01/17/2017 8:45 AM Medical Record Number: [id number] Patient Account Number: [id number] Date of Birth/Sex: 03/04/1996 (20 y.o. Male) Treating RN: Phillis Haggis Primary Care Provider: Corky Downs Other Clinician: Referring Provider: Corky Downs Treating Provider/Extender: Maxwell Caul Weeks in Treatment: 3 History of Present Illness HPI Description: 12/27/16 patient presents today for evaluation concerning his left great toe where he has an ingrown toenail on the lateral aspect which has been giving him a lot of discomfort over the past 1-2 months. He has not been on antibiotics at this point he was evaluated by his primary care provider she did refer him to ask for further evaluation. He has also been referred to podiatry initially but due to issues with his Medicaid that appointment was initially canceled and rescheduled but will not be until the end of this month. Subsequently the appointment was made to see [place] which was able to be done sooner in the meantime. Pain has been intermittent mainly with walking he tells me that he was not really able to rate the pain he just described it as a source/sharp sensation. Fortunately he has no erythema spreading up the toe no evidence of worsening cellulitis. 01/04/16; the patient arrives today with the left great toe causing no discomfort. He still has swollen soft tissue on the medial aspect of the toe which looks like a resolving paronychia however there is absolutely no tenderness here. I found this somewhat unusual. Per our intake nurse the area looks a lot better than last week 01/18/16; this is a patient arrived with an ingrown toenail and a paronychia of the left great toe. He was treated with partial nail removal on the lateral aspect on arrival. The area has healed today he has been using topical antibiotics after  cleaning the wound with soap and water. Electronic Signature(s) Signed: 01/17/2017 6:05:23 PM By: Baltazar Najjar MD Entered By: Baltazar Najjar on 01/17/2017 08:57:50 Jesse Mckenzie ([id number]) -------------------------------------------------------------------------------- Physical Exam Details Patient Name: Jesse Mckenzie Date of Service: 01/17/2017 8:45 AM Medical Record Number: [id number] Patient Account Number: [id number] Date of Birth/Sex: 1995/11/16 (20 y.o. Male) Treating RN: Phillis Haggis Primary Care Provider: Corky Downs Other Clinician: Referring Provider: Corky Downs Treating Provider/Extender: Maxwell Caul Weeks in Treatment: 3 Constitutional Sitting or standing Blood Pressure is within target range for patient.. Pulse regular and within target range for patient.Marland Kitchen Respirations regular, non-labored and within target range.. Temperature is normal and within the target range for the patient.Marland Kitchen appears in no distress. Eyes Conjunctivae clear. No discharge. Respiratory Respiratory effort is easy and symmetric bilaterally. Rate is normal at rest and on room air.. Cardiovascular Pedal pulses palpable and strong bilaterally.. Lymphatic None palpable in the left popliteal or inguinal area. Psychiatric No evidence of depression, anxiety, or agitation. Calm, cooperative, and communicative. Appropriate interactions and affect.. Notes Wound exam; the patient's nail bed and surrounding soft tissue are completely healed. There is no swelling or tenderness. No drainage and no erythema. Electronic Signature(s) Signed: 01/17/2017 6:05:23 PM By: Baltazar Najjar MD Entered By: Baltazar Najjar on 01/17/2017 08:59:16 Jesse Mckenzie ([id number]) -------------------------------------------------------------------------------- Physician Orders Details Patient Name: Jesse Mckenzie Date of Service: 01/17/2017 8:45 AM Medical Record Number: [id number] Patient Account  Number: [id number] Date of Birth/Sex: Feb 28, 1996 (20 y.o. Male) Treating RN: Phillis Haggis Primary Care Provider: Corky Downs Other Clinician: Referring Provider: Corky Downs Treating Provider/Extender: Maxwell Caul Weeks in Treatment: 3  Verbal / Phone Orders: Yes Clinician: Ashok CordiaPinkerton, Debi Read Back and Verified: Yes Diagnosis Coding [facility] Discharge from Wound Care Center - Keep area clean and dry. Monitor area for drainage and redness. Please call our office if you have any questions or concerns. Electronic Signature(s) Signed: 01/17/2017 5:31:33 PM By: Alejandro MullingPinkerton, Debra Signed: 01/17/2017 6:05:23 PM By: Baltazar Najjarobson, Candies Palm MD Entered By: Alejandro MullingPinkerton, Debra on 01/17/2017 08:53:26 Jesse FreerGRIER, Jesse R. ([id number]) -------------------------------------------------------------------------------- Problem List Details Patient Name: Jesse FreerGRIER, Jesse R. Date of Service: 01/17/2017 8:45 AM Medical Record Number: [id number] Patient Account Number: [id number] Date of Birth/Sex: 05/27/1996 (20 y.o. Male) Treating RN: Phillis HaggisPinkerton, Debi Primary Care Provider: Corky DownsMASOUD, JAVED Other Clinician: Referring Provider: Corky DownsMASOUD, JAVED Treating Provider/Extender: Maxwell CaulOBSON, Argus Caraher G Weeks in Treatment: 3 Active Problems ICD-10 Encounter Code Description Active Date Diagnosis L60.0 Ingrowing nail 12/27/2016 Yes L03.032 Cellulitis of left toe 12/27/2016 Yes F79 Unspecified intellectual disabilities 12/27/2016 Yes F42.9 Obsessive-compulsive disorder, unspecified 12/27/2016 Yes Inactive Problems Resolved Problems Electronic Signature(s) Signed: 01/17/2017 6:05:23 PM By: Baltazar Najjarobson, Ngina Royer MD Entered By: Baltazar Najjarobson, Jenyfer Trawick on 01/17/2017 08:56:42 Jesse FreerGRIER, Jesse R. ([id number]) -------------------------------------------------------------------------------- Progress Note Details Patient Name: Jesse FreerGRIER, Jesse R. Date of Service: 01/17/2017 8:45 AM Medical Record Number: [id number] Patient Account  Number: [id number] Date of Birth/Sex: 01/26/1996 (20 y.o. Male) Treating RN: Phillis HaggisPinkerton, Debi Primary Care Provider: Corky DownsMASOUD, JAVED Other Clinician: Referring Provider: Corky DownsMASOUD, JAVED Treating Provider/Extender: Maxwell CaulOBSON, Madisyn M[place]ney G Weeks in Treatment: 3 Subjective History of Present Illness (HPI) 12/27/16 patient presents today for evaluation concerning his left great toe where he has an ingrown toenail on the lateral aspect which has been giving him a lot of discomfort over the past 1-2 months. He has not been on antibiotics at this point he was evaluated by his primary care provider she did refer him to ask for further evaluation. He has also been referred to podiatry initially but due to issues with his Medicaid that appointment was initially canceled and rescheduled but will not be until the end of this month. Subsequently the appointment was made to see us which was able to be done sooner in the meantime. Pain has been intermittent mainly with walking he tells me that he was not really able to rate the pain he just described it as a source/sharp sensation. Fortunately he has no erythema spreading up the toe no evidence of worsening cellulitis. 01/04/16; the patient arrives today with the left great toe causing no discomfort. He still has swollen soft tissue on the medial aspect of the toe which looks like a resolving paronychia however there is absolutely no tenderness here. I found this somewhat unusual. Per our intake nurse the area looks a lot better than last week 01/18/16; this is a patient arrived with an ingrown toenail and a paronychia of the left great toe. He was treated with partial nail removal on the lateral aspect on arrival. The area has healed today he has been using topical antibiotics after cleaning the wound with soap and water. Objective Constitutional Sitting or standing Blood Pressure is within target range for patient.. Pulse regular and within target range for patient.Marland Kitchen.  Respirations regular, non-labored and within target range.. Temperature is normal and within the target range for the patient.Marland Kitchen. appears in no distress. Vitals Time Taken: 8:43 AM, Height: 70 in, Weight: 192.2 lbs, BMI: 27.6, Temperature: 97.8 F, Pulse: 91 bpm, Respiratory Rate: 20 breaths/min, Blood Pressure: 122/79 mmHg. Eyes Conjunctivae clear. No discharge. Jesse FreerGRIER, Jesse R. ([id number]) Respiratory Respiratory effort is easy and symmetric bilaterally. Rate is normal  at rest and on room air.. Cardiovascular Pedal pulses palpable and strong bilaterally.. Lymphatic None palpable in the left popliteal or inguinal area. Psychiatric No evidence of depression, anxiety, or agitation. Calm, cooperative, and communicative. Appropriate interactions and affect.. General Notes: Wound exam; the patient's nail bed and surrounding soft tissue are completely healed. There is no swelling or tenderness. No drainage and no erythema. Integumentary (Hair, Skin) Wound #1 status is Open. Original cause of wound was Gradually Appeared. The wound is located on the ARAMARK CorporationLeft,Dorsal Toe Great. The wound measures 0cm length x 0cm width x 0cm depth; 0cm^2 area and 0cm^3 volume. There is no tunneling or undermining noted. There is a none present amount of drainage noted. The wound margin is distinct with the outline attached to the wound base. There is no granulation within the wound bed. There is no necrotic tissue within the wound bed. The periwound skin appearance exhibited: Erythema. The periwound skin appearance did not exhibit: Dry/Scaly. The surrounding wound skin color is noted with erythema which is circumferential. Periwound temperature was noted as No Abnormality. The periwound has tenderness on palpation. Assessment Active Problems ICD-10 L60.0 - Ingrowing nail L03.032 - Cellulitis of left toe F79 - Unspecified intellectual disabilities F42.9 - Obsessive-compulsive disorder,  unspecified Plan [facility]: Discharge from Wound Care Center - Keep area clean and dry. Monitor area for drainage and redness. Jesse FreerGRIER, Jesse R. ([id number]) Please call our office if you have any questions or concerns. #1 I think the patient can be discharged from the wound care center #2 advised to keep the area clean and dry and monitor. Electronic Signature(s) Signed: 01/17/2017 6:05:23 PM By: Baltazar Najjarobson, Sherryl Valido MD Entered By: Baltazar Najjarobson, Alicea Wente on 01/17/2017 09:00:00 Jesse FreerGRIER, Jesse R. ([id number]) -------------------------------------------------------------------------------- SuperBill Details Patient Name: Jesse FreerGRIER, Jesse R. Date of Service: 01/17/2017 Medical Record Number: [id number] Patient Account Number: [id number] Date of Birth/Sex: 12/06/1995 (20 y.o. Male) Treating RN: Phillis HaggisPinkerton, Debi Primary Care Provider: Corky DownsMASOUD, JAVED Other Clinician: Referring Provider: Corky DownsMASOUD, JAVED Treating Provider/Extender: Maxwell CaulOBSON, Taison Celani G Weeks in Treatment: 3 Diagnosis Coding ICD-10 Codes Code Description L60.0 Ingrowing nail L03.032 Cellulitis of left toe F79 Unspecified intellectual disabilities F42.9 Obsessive-compulsive disorder, unspecified Facility Procedures CPT4 Code: [id number] Description: [id number] - WOUND CARE VISIT-LEV 2 EST PT Modifier: Quantity: 1 Physician Procedures CPT4 Code: [id number] Description: 99213 - WC PHYS LEVEL 3 - EST PT ICD-10 Description Diagnosis L60.0 Ingrowing nail L03.032 Cellulitis of left toe Modifier: Quantity: 1 Electronic Signature(s) Signed: 01/17/2017 5:31:33 PM By: Alejandro MullingPinkerton, Debra Signed: 01/17/2017 6:05:23 PM By: Baltazar Najjarobson, Jaksen Fiorella MD Entered By: Alejandro MullingPinkerton, Debra on 01/17/2017 09:01:57

## 2017-04-24 ENCOUNTER — Other Ambulatory Visit: Payer: Self-pay

## 2017-04-24 ENCOUNTER — Emergency Department
Admission: EM | Admit: 2017-04-24 | Discharge: 2017-05-10 | Disposition: A | Discharge status: Home / Self Care | Payer: Medicaid Other | Attending: Emergency Medicine | Admitting: Emergency Medicine

## 2017-04-24 ENCOUNTER — Encounter: Payer: Self-pay | Admitting: Emergency Medicine

## 2017-04-24 DIAGNOSIS — F913 Oppositional defiant disorder: Secondary | ICD-10-CM | POA: Diagnosis not present

## 2017-04-24 DIAGNOSIS — F840 Autistic disorder: Secondary | ICD-10-CM | POA: Insufficient documentation

## 2017-04-24 DIAGNOSIS — F4325 Adjustment disorder with mixed disturbance of emotions and conduct: Secondary | ICD-10-CM

## 2017-04-24 DIAGNOSIS — R4689 Other symptoms and signs involving appearance and behavior: Secondary | ICD-10-CM

## 2017-04-24 DIAGNOSIS — F911 Conduct disorder, childhood-onset type: Secondary | ICD-10-CM | POA: Insufficient documentation

## 2017-04-24 DIAGNOSIS — R6250 Unspecified lack of expected normal physiological development in childhood: Secondary | ICD-10-CM

## 2017-04-24 HISTORY — DX: Oppositional defiant disorder: F91.3

## 2017-04-24 HISTORY — DX: Unspecified lack of expected normal physiological development in childhood: R62.50

## 2017-04-24 HISTORY — DX: Borderline personality disorder: F60.3

## 2017-04-24 HISTORY — DX: Autistic disorder: F84.0

## 2017-04-24 LAB — CBC
HEMATOCRIT: 41 % (ref 40.0–52.0)
HEMOGLOBIN: 13.9 g/dL (ref 13.0–18.0)
MCH: 32.5 pg (ref 26.0–34.0)
MCHC: 33.8 g/dL (ref 32.0–36.0)
MCV: 96.1 fL (ref 80.0–100.0)
Platelets: 143 10*3/uL — ABNORMAL LOW (ref 150–440)
RBC: 4.27 MIL/uL — ABNORMAL LOW (ref 4.40–5.90)
RDW: 12.5 % (ref 11.5–14.5)
WBC: 7.3 10*3/uL (ref 3.8–10.6)

## 2017-04-24 LAB — COMPREHENSIVE METABOLIC PANEL
ALBUMIN: 4.4 g/dL (ref 3.5–5.0)
ALT: 57 U/L (ref 17–63)
AST: 62 U/L — AB (ref 15–41)
Alkaline Phosphatase: 54 U/L (ref 38–126)
Anion gap: 9 (ref 5–15)
BUN: 19 mg/dL (ref 6–20)
CALCIUM: 9.8 mg/dL (ref 8.9–10.3)
CO2: 26 mmol/L (ref 22–32)
CREATININE: 1.05 mg/dL (ref 0.61–1.24)
Chloride: 102 mmol/L (ref 101–111)
GFR calc Af Amer: >60 mL/min (ref >60)
GFR calc non Af Amer: >60 mL/min (ref >60)
Glucose, Bld: 103 mg/dL — ABNORMAL HIGH (ref 65–99)
Potassium: 4 mmol/L (ref 3.5–5.1)
Sodium: 137 mmol/L (ref 135–145)
Total Bilirubin: 0.6 mg/dL (ref 0.3–1.2)
Total Protein: 8.2 g/dL — ABNORMAL HIGH (ref 6.5–8.1)

## 2017-04-24 LAB — URINE DRUG SCREEN, QUALITATIVE (ARMC ONLY)
Amphetamines, Ur Screen: NOT DETECTED
BARBITURATES, UR SCREEN: NOT DETECTED
BENZODIAZEPINE, UR SCRN: NOT DETECTED
CANNABINOID 50 NG, UR ~~LOC~~: NOT DETECTED
COCAINE METABOLITE, UR ~~LOC~~: NOT DETECTED
MDMA (Ecstasy)Ur Screen: NOT DETECTED
Methadone Scn, Ur: NOT DETECTED
OPIATE, UR SCREEN: NOT DETECTED
PHENCYCLIDINE (PCP) UR S: NOT DETECTED
Tricyclic, Ur Screen: POSITIVE — AB

## 2017-04-24 LAB — ETHANOL

## 2017-04-24 LAB — ACETAMINOPHEN LEVEL: Acetaminophen (Tylenol), Serum: <10 ug/mL — ABNORMAL LOW (ref 10–30)

## 2017-04-24 LAB — SALICYLATE LEVEL: Salicylate Lvl: <7 mg/dL (ref 2.8–30.0)
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
## 2017-04-24 NOTE — ED Notes (Signed)
ED MD at the bedside for pt evaluation  °
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
## 2017-04-24 NOTE — ED Notes (Signed)
Pt given urine cup and is aware of the need for a sample.  Pt agreeable

## 2017-04-24 NOTE — ED Notes (Signed)
Pt given meal tray and drink.

## 2017-04-24 NOTE — ED Notes (Signed)
Pt given sandwich tray and soda

## 2017-04-25 DIAGNOSIS — R6250 Unspecified lack of expected normal physiological development in childhood: Secondary | ICD-10-CM

## 2017-04-25 DIAGNOSIS — F840 Autistic disorder: Secondary | ICD-10-CM

## 2017-04-25 DIAGNOSIS — F4325 Adjustment disorder with mixed disturbance of emotions and conduct: Secondary | ICD-10-CM

## 2017-04-25 MED ORDER — HALOPERIDOL 2 MG PO TABS
4.0000 mg | ORAL_TABLET | Freq: Two times a day (BID) | ORAL | Status: DC
  Administered 2017-04-25 – 2017-05-10 (×30): 4 mg via ORAL
  Filled 2017-04-25: qty 8
  Filled 2017-04-25 (×4): qty 2
  Filled 2017-04-25: qty 8
  Filled 2017-04-25 (×3): qty 2
  Filled 2017-04-25: qty 8
  Filled 2017-04-25 (×2): qty 2
  Filled 2017-04-25: qty 8
  Filled 2017-04-25 (×2): qty 2
  Filled 2017-04-25: qty 8
  Filled 2017-04-25 (×3): qty 2
  Filled 2017-04-25 (×2): qty 8
  Filled 2017-04-25 (×6): qty 2
  Filled 2017-04-25 (×2): qty 8
  Filled 2017-04-25 (×3): qty 2

## 2017-04-25 MED ORDER — TRAZODONE HCL 50 MG PO TABS
50.0000 mg | ORAL_TABLET | Freq: Every evening | ORAL | Status: DC | PRN
  Administered 2017-04-25 – 2017-05-09 (×11): 50 mg via ORAL
  Filled 2017-04-25 (×12): qty 1

## 2017-04-25 MED ORDER — BENZTROPINE MESYLATE 1 MG PO TABS
1.0000 mg | ORAL_TABLET | Freq: Two times a day (BID) | ORAL | Status: DC
  Administered 2017-04-25 – 2017-05-10 (×31): 1 mg via ORAL
  Filled 2017-04-25 (×31): qty 1

## 2017-04-25 MED ORDER — DIVALPROEX SODIUM ER 500 MG PO TB24
2000.0000 mg | ORAL_TABLET | Freq: Every day | ORAL | Status: DC
  Administered 2017-04-25 – 2017-05-09 (×15): 2000 mg via ORAL
  Filled 2017-04-25 (×15): qty 4

## 2017-04-25 MED ORDER — HALOPERIDOL 0.5 MG PO TABS
ORAL_TABLET | ORAL | Status: AC
  Filled 2017-04-25: qty 1

## 2017-04-25 MED ORDER — GABAPENTIN 100 MG PO CAPS
100.0000 mg | ORAL_CAPSULE | Freq: Three times a day (TID) | ORAL | Status: DC
  Administered 2017-04-25 – 2017-05-10 (×45): 100 mg via ORAL
  Filled 2017-04-25 (×45): qty 1

## 2017-04-25 MED ORDER — QUETIAPINE FUMARATE 200 MG PO TABS
300.0000 mg | ORAL_TABLET | Freq: Every day | ORAL | Status: DC
  Administered 2017-04-25 – 2017-05-09 (×15): 300 mg via ORAL
  Filled 2017-04-25 (×15): qty 1

## 2017-04-25 MED ORDER — CLONAZEPAM 0.5 MG PO TABS
0.5000 mg | ORAL_TABLET | Freq: Three times a day (TID) | ORAL | Status: DC
  Administered 2017-04-25 – 2017-05-10 (×45): 0.5 mg via ORAL
  Filled 2017-04-25 (×45): qty 1

## 2017-04-25 NOTE — ED Notes (Signed)
Hourly rounding reveals patient sleeping in room. No complaints, stable, in no acute distress. Q15 minute rounds and monitoring via Security Cameras to continue. 

## 2017-04-25 NOTE — ED Notes (Signed)
Pt. Transferred to [facility] from ED to room after screening for contraband. Report to include Situation, Background, Assessment and Recommendations from [employer] Express[employer]rea RN. Pt. Oriented to unit including Q15 minute rounds as well as the security cameras for their protection. Patient is alert and oriented, warm and dry in no acute distress. Patient denies SI, HI, and AVH. Pt. Encouraged to let me know if needs arise.
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
## 2017-04-25 NOTE — ED Notes (Signed)
Attempted to call group home back. Left message on machine to call hospital.

## 2017-04-25 NOTE — Discharge Instructions (Signed)
Return to the emergency department for thoughts of wanting to hurt yourself or others or any other acute symptoms that concern you.

## 2017-04-25 NOTE — ED Notes (Signed)
Patient up to nursing station, stating he is cold. Blanket given and breakfast tray. He states am I doing a good job. Are you documenting it? Writer replied you are doing and excellent job. He is pleasant and cooperative.

## 2017-04-25 NOTE — ED Notes (Signed)
Jesse Mckenzie guardian [phone] After hours number [phone]

## 2017-04-25 NOTE — ED Provider Notes (Signed)
-----------------------------------------   12:57 PM on 04/25/2017 -----------------------------------------  Dr. Toni Amendlapacs has evaluated the patient, and recommends discharge home.  Patient is psychiatrically cleared and at his baseline.  Nursing will contact the group home to arrange for patient to be taken back.     Dionne BucySiadecki, Lynzie Cliburn, MD 04/25/17 1258

## 2017-04-25 NOTE — ED Notes (Signed)
Called and spoke with group home informing them of patient discharge. Was told to call Jesse Mckenzie his guardian. Tried both numbers given. Called after hours number spoke with operator who said she was going to email supervisor of patient dc from [facility].

## 2017-04-25 NOTE — ED Notes (Signed)
Report to oncoming nurses. 
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
## 2017-04-26 NOTE — ED Notes (Signed)
Pt is alert and oriented this evening. Pt mood is anxious but he is pleasant and cooperative with staff. Pt denies SI/HI and AVH at this time. Pt is preoccupied with going back to his group home. Writer discussed circumstances, provided snack and 15 minute checks are ongoing for safety. Pt is also taking medications as prescribed.

## 2017-04-26 NOTE — ED Notes (Signed)
RN told patient he would be placed at a new group home.  Pt was accepting, only voicing concerns about getting his clothing and personal belongings. RN offered reassurance these details would be figured out.  At patient's request, this Clinical research associatewriter called guardian and left voicemail asking for a return phone call so patient could speak with her. Maintained on 15 minute checks and observation by security camera for safety.
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
## 2017-04-26 NOTE — ED Notes (Signed)
Patient asleep in room. No noted distress or abnormal behavior. Will continue 15 minute checks and observation by security cameras for safety. 

## 2017-04-26 NOTE — Consult Note (Signed)
[facility] Consult   Reason for Consult: Follow-up patient in the emergency room Referring Physician: Jimmye Norman Patient Identification: Jesse Mckenzie MRN:  [id number] Principal Diagnosis: Autism Diagnosis:   Patient Active Problem List   Diagnosis Date Noted  . Autism [F84.0] 04/25/2017  . Developmental delay [R62.50] 04/25/2017  . Adjustment disorder with mixed disturbance of emotions and conduct [F43.25] 04/25/2017    Total Time spent with patient: 15 minutes  Subjective:   Jesse Mckenzie is a 21 y.o. male patient admitted with "when can I go".  HPI: 21 year old man with autistic spectrum disorder.  He was planned to be discharged yesterday from the emergency room but apparently his group home is refusing to take him back.  Therefore his guardian needs to find new placement.  Patient has no current complaints.  Behavior has been calm and cooperative  Past Psychiatric History: History of autistic spectrum disorder and intermittent explosive anger  Risk to Self: Is patient at risk for suicide?: No Risk to Others:   Prior Inpatient Therapy:   Prior Outpatient Therapy:    Past Medical History:  Past Medical History:  Diagnosis Date  . Autism   . Developmental delay   . 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 Oppositional defiant disorder    History reviewed. No pertinent surgical history. Family History: No family history on file. Family Psychiatric  History: Negative Social History:  Social History   Substance and Sexual Activity  Alcohol Use No  . Frequency: Never     Social History   Substance and Sexual Activity  Drug Use No    Social History   Socioeconomic History  . Marital status: Single    Spouse name: None  . Number of children: None  . Years of education: None  . Highest education level: None  Social Needs  . Financial resource strain: None  . Food insecurity - worry: None  . Food insecurity - inability: None  . Transportation  needs - medical: None  . Transportation needs - non-medical: None  Occupational History  . None  Tobacco Use  . Smoking status: Never Smoker  . Smokeless tobacco: Never Used  Substance and Sexual Activity  . Alcohol use: No    Frequency: Never  . Drug use: No  . Sexual activity: No  Other Topics Concern  . None  Social History Narrative  . None   Additional Social History:    Allergies:  No Known Allergies  Labs:  Results for orders placed or performed during the hospital encounter of 04/24/17 (from the past 48 hour(s))  Comprehensive metabolic panel     Status: Abnormal   Collection Time: 04/24/17  9:59 PM  Result Value Ref Range   Sodium 137 135 - 145 mmol/L   Potassium 4.0 3.5 - 5.1 mmol/L   Chloride 102 101 - 111 mmol/L   CO2 26 22 - 32 mmol/L   Glucose, Bld 103 (H) 65 - 99 mg/dL   BUN 19 6 - 20 mg/dL   Creatinine, Ser 1.05 0.61 - 1.24 mg/dL   Calcium 9.8 8.9 - 10.3 mg/dL   Total Protein 8.2 (H) 6.5 - 8.1 g/dL   Albumin 4.4 3.5 - 5.0 g/dL   AST 62 (H) 15 - 41 U/L   ALT 57 17 - 63 U/L   Alkaline Phosphatase 54 38 - 126 U/L   Total Bilirubin 0.6 0.3 - 1.2 mg/dL   GFR calc non Af Amer >60 >60 mL/min   GFR calc  Af Amer >60 >60 mL/min    Comment: (NOTE) The eGFR has been calculated using the CKD EPI equation. This calculation has not been validated in all clinical situations. eGFR's persistently <60 mL/min signify possible Chronic Kidney Disease.    Anion gap 9 5 - 15  Ethanol     Status: None   Collection Time: 04/24/17  9:59 PM  Result Value Ref Range   Alcohol, Ethyl (B) <10 <10 mg/dL    Comment:        LOWEST DETECTABLE LIMIT FOR SERUM ALCOHOL IS 10 mg/dL FOR MEDICAL PURPOSES ONLY   Salicylate level     Status: None   Collection Time: 04/24/17  9:59 PM  Result Value Ref Range   Salicylate Lvl <7.8 2.8 - 30.0 mg/dL  Acetaminophen level     Status: Abnormal   Collection Time: 04/24/17  9:59 PM  Result Value Ref Range   Acetaminophen (Tylenol), Serum  <10 (L) 10 - 30 ug/mL    Comment:        THERAPEUTIC CONCENTRATIONS VARY SIGNIFICANTLY. A RANGE OF 10-30 ug/mL MAY BE AN EFFECTIVE CONCENTRATION FOR MANY PATIENTS. HOWEVER, SOME ARE BEST TREATED AT CONCENTRATIONS OUTSIDE THIS RANGE. ACETAMINOPHEN CONCENTRATIONS >150 ug/mL AT 4 HOURS AFTER INGESTION AND >50 ug/mL AT 12 HOURS AFTER INGESTION ARE OFTEN ASSOCIATED WITH TOXIC REACTIONS.   cbc     Status: Abnormal   Collection Time: 04/24/17  9:59 PM  Result Value Ref Range   WBC 7.3 3.8 - 10.6 K/uL   RBC 4.27 (L) 4.40 - 5.90 MIL/uL   Hemoglobin 13.9 13.0 - 18.0 g/dL   HCT 41.0 40.0 - 52.0 %   MCV 96.1 80.0 - 100.0 fL   MCH 32.5 26.0 - 34.0 pg   MCHC 33.8 32.0 - 36.0 g/dL   RDW 12.5 11.5 - 14.5 %   Platelets 143 (L) 150 - 440 K/uL  Urine Drug Screen, Qualitative     Status: Abnormal   Collection Time: 04/24/17  9:59 PM  Result Value Ref Range   Tricyclic, Ur Screen POSITIVE (A) NONE DETECTED   Amphetamines, Ur Screen NONE DETECTED NONE DETECTED   MDMA (Ecstasy)Ur Screen NONE DETECTED NONE DETECTED   Cocaine Metabolite,Ur [place] NONE DETECTED NONE DETECTED   Opiate, Ur Screen NONE DETECTED NONE DETECTED   Phencyclidine (PCP) Ur S NONE DETECTED NONE DETECTED   Cannabinoid 50 Ng, Ur [place] NONE DETECTED NONE DETECTED   Barbiturates, Ur Screen NONE DETECTED NONE DETECTED   Benzodiazepine, Ur Scrn NONE DETECTED NONE DETECTED   Methadone Scn, Ur NONE DETECTED NONE DETECTED    Comment: (NOTE) 295  Tricyclics, urine               Cutoff 1000 ng/mL 200  Amphetamines, urine             Cutoff 1000 ng/mL 300  MDMA (Ecstasy), urine           Cutoff 500 ng/mL 400  Cocaine Metabolite, urine       Cutoff 300 ng/mL 500  Opiate, urine                   Cutoff 300 ng/mL 600  Phencyclidine (PCP), urine      Cutoff 25 ng/mL 700  Cannabinoid, urine              Cutoff 50 ng/mL 800  Barbiturates, urine             Cutoff 200 ng/mL 900  Benzodiazepine, urine  Cutoff 200 ng/mL 1000 Methadone,  urine                Cutoff 300 ng/mL 1100 1200 The urine drug screen provides only a preliminary, unconfirmed 1300 analytical test result and should not be used for non-medical 1400 purposes. Clinical consideration and professional judgment should 1500 be applied to any positive drug screen result due to possible 1600 interfering substances. A more specific alternate chemical method 1700 must be used in order to obtain a confirmed analytical result.  1800 Gas chromato graphy / mass spectrometry (GC/MS) is the preferred 1900 confirmatory method.     Current Facility-Administered Medications  Medication Dose Route Frequency Provider Last Rate Last Dose  . benztropine (COGENTIN) tablet 1 mg  1 mg Oral BID Clapacs, Madie Reno, MD   1 mg at 04/26/17 0955  . clonazePAM (KLONOPIN) tablet 0.5 mg  0.5 mg Oral TID Clapacs, Madie Reno, MD   0.5 mg at 04/26/17 0955  . divalproex (DEPAKOTE ER) 24 hr tablet 2,000 mg  2,000 mg Oral QHS Clapacs, John T, MD   2,000 mg at 04/25/17 2114  . gabapentin (NEURONTIN) capsule 100 mg  100 mg Oral TID Clapacs, John T, MD   100 mg at 04/26/17 0955  . haloperidol (HALDOL) tablet 4 mg  4 mg Oral BID Clapacs, Madie Reno, MD   4 mg at 04/26/17 0955  . QUEtiapine (SEROQUEL) tablet 300 mg  300 mg Oral QHS Clapacs, Madie Reno, MD   300 mg at 04/25/17 2114  . traZODone (DESYREL) tablet 50 mg  50 mg Oral QHS PRN Clapacs, Madie Reno, MD   50 mg at 04/25/17 2114   No current outpatient medications on file.    Musculoskeletal: Strength & Muscle Tone: within normal limits Gait & Station: normal Patient leans: N/A  Psychiatric Specialty Exam: Physical Exam  Nursing note and vitals reviewed. Constitutional: He appears well-developed and well-nourished.  HENT:  Head: Normocephalic and atraumatic.  Eyes: Conjunctivae are normal. Pupils are equal, round, and reactive to light.  Neck: Normal range of motion.  Cardiovascular: Regular rhythm and normal heart sounds.  Respiratory: Effort  normal. No respiratory distress.  GI: Soft.  Musculoskeletal: Normal range of motion.  Neurological: He is alert.  Skin: Skin is warm and dry.  Psychiatric: His affect is blunt. His speech is delayed. He is slowed. Thought content is not paranoid. Cognition and memory are normal. He expresses impulsivity. He expresses no homicidal and no suicidal ideation.    Review of Systems  Constitutional: Negative.   HENT: Negative.   Eyes: Negative.   Respiratory: Negative.   Cardiovascular: Negative.   Gastrointestinal: Negative.   Musculoskeletal: Negative.   Skin: Negative.   Neurological: Negative.   Psychiatric/Behavioral: Negative.     Blood pressure 110/62, pulse 82, temperature 97.7 F (36.5 C), temperature source Oral, resp. rate 18, height _0  (1.702 m), weight 68 kg (150 lb), SpO2 99 %.Body mass index is 23.49 kg/m.  General Appearance: Fairly Groomed  Eye Contact:  Good  Speech:  Slow  Volume:  Decreased  Mood:  Euthymic  Affect:  Constricted  Thought Process:  Goal Directed  Orientation:  Full (Time, Place, and Person)  Thought Content:  Logical  Suicidal Thoughts:  No  Homicidal Thoughts:  No  Memory:  Immediate;   Good Recent;   Fair Remote;   Fair  Judgement:  Fair  Insight:  Fair  Psychomotor Activity:  Decreased  Concentration:  Concentration: Fair  Recall:  [place] of Knowledge:  Fair  Language:  Fair  Akathisia:  No  Handed:  Right  AIMS (if indicated):     Assets:  Desire for Improvement Physical Health Resilience Social Support  ADL's:  Intact  Cognition:  Impaired,  Mild  Sleep:        Treatment Plan Summary: Medication management and Plan No change to medication management.  Supportive counseling.  We are holding him here just because his guardian has not yet found a safe disposition.  No other change to treatment.  Disposition: Patient does not meet criteria for psychiatric inpatient admission.  Alethia Berthold, MD 04/26/2017 4:36 PM

## 2017-04-26 NOTE — ED Notes (Signed)
VOL/ Holding til Guardian finds placement

## 2017-04-26 NOTE — [place] FL2 (Signed)
   MEDICAID FL2 LEVEL OF CARE SCREENING TOOL     IDENTIFICATION  Patient Name: Jesse Mckenzie Birthdate: 04/04/1996 Sex: male Admission Date (Current Location): 04/24/2017  [place]  [place] [id number] [facility]  [facility], [address], [place], [place] [address]      Provider Number: [id number]  Attending Physician Name and Address:  No att. providers found  Relative Name and Phone Number:  Legal Rosiland OzGuardian Amy Gilliam [phone]    Current Level of Care: Hospital Recommended Level of Care: Other (Comment)(Group Home) Prior Approval Number:    Date Approved/Denied:   PASRR Number:    Discharge Plan: Other (Comment)(Group Home)    Current Diagnoses: Patient Active Problem List   Diagnosis Date Noted  . Autism 04/25/2017  . Developmental delay 04/25/2017  . 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    Orientation RESPIRATION BLADDER Height & Weight     Self, Time, Situation, Place  Normal Continent Weight: 150 lb (68 kg) Height:  5\' 7"  (170.2 cm)  BEHAVIORAL SYMPTOMS/MOOD NEUROLOGICAL BOWEL NUTRITION STATUS  Other (Comment)(Hx of physical aggression)   Continent Diet(Regular Diet)  AMBULATORY STATUS COMMUNICATION OF NEEDS Skin   Independent Verbally Normal                       Personal Care Assistance Level of Assistance  Bathing, Feeding, Dressing Bathing Assistance: Independent Feeding assistance: Independent Dressing Assistance: Independent     Functional Limitations Info  Hearing, Speech, Sight Sight Info: Adequate Hearing Info: Adequate Speech Info: Adequate    SPECIAL CARE FACTORS FREQUENCY                       Contractures Contractures Info: Not present    Additional Factors Info  Code Status, Allergies Code Status Info: Full Allergies Info: No Known Allergies Psychotropic Info: See MAR         Current Medications  (04/26/2017):  This is the current hospital active medication list Current Facility-Administered Medications  Medication Dose Route Frequency Provider Last Rate Last Dose  . benztropine (COGENTIN) tablet 1 mg  1 mg Oral BID Clapacs, Jackquline DenmarkJohn T, MD   1 mg at 04/26/17 0955  . clonazePAM (KLONOPIN) tablet 0.5 mg  0.5 mg Oral TID Clapacs, Jackquline DenmarkJohn T, MD   0.5 mg at 04/26/17 0955  . divalproex (DEPAKOTE ER) 24 hr tablet 2,000 mg  2,000 mg Oral QHS Clapacs, John T, MD   2,000 mg at 04/25/17 2114  . gabapentin (NEURONTIN) capsule 100 mg  100 mg Oral TID Clapacs, John T, MD   100 mg at 04/26/17 0955  . haloperidol (HALDOL) tablet 4 mg  4 mg Oral BID Clapacs, Jackquline DenmarkJohn T, MD   4 mg at 04/26/17 0955  . QUEtiapine (SEROQUEL) tablet 300 mg  300 mg Oral QHS Clapacs, Jackquline DenmarkJohn T, MD   300 mg at 04/25/17 2114  . traZODone (DESYREL) tablet 50 mg  50 mg Oral QHS PRN Clapacs, Jackquline DenmarkJohn T, MD   50 mg at 04/25/17 2114   No current outpatient medications on file.     Discharge Medications: Please see discharge summary for a list of discharge medications.  Relevant Imaging Results:  Relevant Lab Results:   Additional Information SSN: [id number]  Dominic PeaJeneya G Ulanda Tackett, LCSW

## 2017-04-26 NOTE — ED Notes (Signed)
Pt eating lunch tray. No needs or concerns at this time. Maintained on 15 minute checks and observation by security camera for safety.

## 2017-04-26 NOTE — ED Notes (Signed)
Amy Gillum (pt's guardian) called to inform RN patient has been immediately discharged from his current group home. Ms. Cathe MonsGillum stated she has already begun the process of finding new placement. Ms. Cathe MonsGillum requested an FL2 be faxed to her at [phone].

## 2017-04-26 NOTE — ED Notes (Signed)
Pt remains calm and cooperative. Compliant with all medications and VS. No behavioral issues. Maintained on 15 minute checks and observation by security camera for safety.

## 2017-04-27 NOTE — Clinical Social Work Note (Signed)
CSW spoke with and faxed [phone]) clinicals to Leanora Coverurtis Tor[facility]. Mr. Gala Lewandowskyorain to follow up with CSW after reviewing. No other leads at this time. Pt is a "difficult to place" and Social Work Asst. Director Zackary Shon BatonBrooks is aware. CSW continuing to follow for placement needs.   Corlis HoveJeneya Celenia Hruska, Theresia MajorsLCSWA, [place] [facility] Clinical Social Worker-ED [phone]

## 2017-04-27 NOTE — ED Provider Notes (Signed)
-----------------------------------------   7:59 AM on 04/27/2017 -----------------------------------------   Blood pressure 134/76, pulse 100, temperature 97.7 F (36.5 C), temperature source Oral, resp. rate 18, height 5\' 7"  (1.702 m), weight 68 kg (150 lb), SpO2 100 %.  The patient had no acute events since last update.  Calm and cooperative at this time. Disposition is pending psychiatric placement.    Irean HongSung, Kajah Santizo J, MD 04/27/17 [phone]

## 2017-04-27 NOTE — ED Notes (Signed)

## 2017-04-27 NOTE — ED Notes (Signed)
Pt. Alert and oriented, warm and dry, in no distress. Pt. Denies SI, HI, and AVH. Pt. Encouraged to let nursing staff know of any concerns or needs. 

## 2017-04-27 NOTE — ED Notes (Signed)
Pt is currently sitting in dayroom. Pt denies pain, ah/vh/si/and hi at this time. Pt consumed  100% of his breakfast. No s/sx of distress reported or noted. Pt conts to have a blank stare when communicating with him. Remains on Q15 mins safety rounds. Will cont to monitor pt.

## 2017-04-27 NOTE — ED Notes (Signed)
Pt showered without incident. Will cont to monitor pt.

## 2017-04-28 NOTE — ED Notes (Signed)
Patient took morning medications, no signs of distress.

## 2017-04-28 NOTE — ED Notes (Signed)

## 2017-04-28 NOTE — ED Notes (Signed)
Patient did eat 100% of supper and beverage. Patient calm and cooperative, no signs of distress. q 15 minute checks.

## 2017-04-28 NOTE — ED Notes (Signed)
Pt. Alert and oriented, warm and dry, in no distress. Pt. Denies SI, HI, and AVH. Pt. Encouraged to let nursing staff know of any concerns or needs. 

## 2017-04-28 NOTE — Progress Notes (Signed)
LCSW called Jesse Mckenzie- [facility] ( potential) to see if he has made a decision- left detailed Hippa friendly message and awaiting call back.  LCSW called Patients guardian to follow up with Jesse Mckenzie and Jesse Mckenzie [employer] awaiting a call back. As per voice message   Jesse Mckenzie [phone] NOT in her office Friday Nov 9-13th called [phone]   COVERAGE WORKER Jesse Mckenzie  [phone] Faxed over FL2 as per DSS Jesse Mckenzie [phone]  Awaiting Call back from coverage worker.   Delta Air LinesClaudine Myliah Medel LCSW [phone]

## 2017-04-28 NOTE — ED Provider Notes (Signed)
-----------------------------------------   6:52 AM on 04/28/2017 -----------------------------------------   Blood pressure 126/67, pulse 97, temperature 98.1 F (36.7 C), temperature source Oral, resp. rate 16, height 5\' 7"  (1.702 m), weight 68 kg (150 lb), SpO2 100 %.  The patient had no acute events since last update.  Sleeping at this time.  Disposition is pending clinical social work team recommendations.     Irean HongSung, Jade J, MD 04/28/17 [phone]

## 2017-04-28 NOTE — Progress Notes (Signed)
LCSW called coverage guardian and we will consult in 30 minutes to review who has been called and who we are awaiting call back. She did mention it may be unlikely he will be placed this weekend.  Breeanna Galgano LCSW

## 2017-04-28 NOTE — Progress Notes (Signed)
LCSW met with patient and explained that his guardian will be away to Nov 13th, 2018, he already knew and also knew that Ms Sherryle Lis will be covering for her. He just wanted to talk to me to see if his personal effects at the last group home will get picked up and taken care of . LCSW gave assurance to patient I will mention it to her when we talk at [place] called LCSW back and he will not be able to assess client at this time ( personal matter funeral out of town) and he may only have another bed in a week. LCSW agreed to let him know on Sunday if patient has been placed.  Spoke to Ms Sherryle Lis guardians coverage SW and she reported she has faxed out information to [facility] and 2 other homes for potential placement.  Awaiting call backs from [place] LCSW [phone]

## 2017-04-29 NOTE — Progress Notes (Signed)
LCSW will call and review several facilities to see if this patient can be placed. Called Guardian coverage worker Ms Lilian KapurMcDonald and left message( normally does not work on weekends)  Delta Air LinesClaudine Aksh Swart LCSW [phone]

## 2017-04-29 NOTE — ED Provider Notes (Signed)
-----------------------------------------   7:21 AM on 04/29/2017 -----------------------------------------   Blood pressure 126/67, pulse 97, temperature 98.1 F (36.7 C), temperature source Oral, resp. rate 16, height 1.702 m (5\' 7" ), weight 68 kg (150 lb), SpO2 100 %.  The patient had no acute events since last update.  Calm and cooperative at this time.  Disposition is pending placement by social work.     Loleta RoseForbach, Mateen Franssen, MD 04/29/17 [phone]

## 2017-04-30 NOTE — ED Notes (Signed)
Pt  Vol  Pending  Placement  

## 2017-04-30 NOTE — Progress Notes (Signed)
LCSW called [facility] and was informed they do not have a bed available anymore. [phone]   Called Clinton SawyerByron White [phone] He is agreeable to meet with patient tomorrow and this worker will have weekday LCSW follow up to arrange time for Monday November 12.  Called Guardian and left detailed message on possibility of [facility] Kick[facility] accepting patient and planned interview on Monday. Weekday LCSW will advise/consult guardian coverage worker Ginger McDonald  Cell and Fax number [phone] fax number [phone]  Arrie SenateClaudine Lessie Funderburke  [phone]

## 2017-04-30 NOTE — ED Notes (Signed)
Patient resting quietly in room. No noted distress or abnormal behaviors noted. Will continue 15 minute checks and observation by security camera for safety. 

## 2017-04-30 NOTE — ED Provider Notes (Signed)
-----------------------------------------   8:52 AM on 04/30/2017 -----------------------------------------   Blood pressure (!) 101/57, pulse 85, temperature 98.1 F (36.7 C), temperature source Oral, resp. rate 16, height 5\' 7"  (1.702 m), weight 68 kg (150 lb), SpO2 99 %.  The patient had no acute events since last update.  Calm and cooperative at this time.  Disposition is pending Psychiatry/Behavioral Medicine team recommendations.     Merrily Brittleifenbark, Kamaiya Antilla, MD 04/30/17 [phone]

## 2017-04-30 NOTE — ED Notes (Addendum)
Pt stated he slept well last night. Cooperative, pleasant with staff. Compliant with medications. No needs or concerns at this time. Maintained on 15 minute checks and observation by security camera for safety.

## 2017-04-30 NOTE — ED Notes (Signed)
Lunch tray placed in patient's room. 

## 2017-04-30 NOTE — ED Notes (Signed)
Pt is alert and oriented this evening. Pt mood is appropriate and he is cooperative with staff. Pt was picking at a scab on his arm. Band aid applied and pt denies self harm. Writer provided food and drink as well and 15 minute checks are ongoing for safety.

## 2017-04-30 NOTE — ED Notes (Signed)
Patient asleep in room. No noted distress or abnormal behavior. Will continue 15 minute checks and observation by security cameras for safety. 

## 2017-04-30 NOTE — ED Notes (Signed)
Patient is voluntary and is pending placement. 

## 2017-04-30 NOTE — Progress Notes (Signed)
LCSW called the following [facility]'s and the outcomes are as follows, this information was faxed to patient guardian to assist with finding suitable placement for patient and to work in partnership and to reduce duplication of calls.  [facility] [phone]. [place] [facility] [place] the home manager will call LCSW back.  [facility]: Will try and come out on Monday for a visit.  [facility]- Faxed information [facility] and left a voice message for [name] [phone]  [facility]- Not in service  [facility] [phone] No beds at this time as per Elna BreslowSteve  [facility] [phone] NO BEDS- last one filled on Saturday  [facility] [phone] unable to reach left voice message  [facility] [phone] Its a fax number unable to leave message  Arrie SenateClaudine Lotta [place] LCSW [phone]

## 2017-04-30 NOTE — ED Notes (Signed)
Report was received from Amy B., RN; Pt. Verbalizes no complaints or distress; denies S.I./Hi.; Pending placement; Continue to monitor with 15 min. Monitoring.

## 2017-04-30 NOTE — ED Notes (Signed)
Pt continually asking about his discharge and guardian. Pt reassured the social workers (and his guardian) are doing all they can have him placed in a new group home. Pt accepting for a while and then asks again. No agitation or behavioral issues.

## 2017-04-30 NOTE — ED Notes (Signed)

## 2017-05-01 NOTE — Progress Notes (Signed)
Pt A & O X2. Denies SI, HI, AVH and pain. Presents with flat affect and depressed mood. Per pt "I just want to go home, I want to live somewhere else than here".  Pt expressed his dissapointment about group home representative not coming today to evaluate him for placement. Encouraged pt to exercise patience and comply with treatment regimen. Placement process reviewed with pt, understanding verbalized. Thought process is logical but concrete / limited. Pt has been cooperative with care thus far. Compliant with medications when offered. Denies adverse drug reactions when assessed. Showered and changed scrubs when prompted. Tolerates all PO intake well. Emotional support and reassurance provided to pt. Safety checks remains at Q 15 minutes intervals without self harm gestures or outburst to note at this time.

## 2017-05-01 NOTE — Clinical Social Work Note (Addendum)
CSW spoke with Clinton SawyerByron White [phone]) of [facility], who stated he would be to [facility] today between 2pm-4pm to assess pt for placement.  CSW left a voicemail for [name] [phone]) of [facility]. CSW left a voicemail for covering [facility] Social Worker Ginger McDonald at [phone]. CSW continuing to follow pt for discharge needs.   Corlis HoveJeneya Sheli Dorin, Theresia MajorsLCSWA, [facility] Clinical Social Worker-ED [phone]

## 2017-05-01 NOTE — ED Notes (Signed)
Pt  Vol  Pending  D/C

## 2017-05-02 NOTE — Clinical Social Work Note (Addendum)
CSW left a voicemail for Ford Motor CompanyByron Mckenzie [phone]) of [facility]. [name] was to assess pt for placement yesterday, but per RN Olivette's note, he did not show up yesterday.  CSW spoke with Jesse Mckenzie at [phone] and updated on progress. [name] left VMs for [name] of [facility] and Jesse Mckenzie at [facility].   CSW spoke with Jesse Mckenzie of [facility] at [phone] who stated they do have vacancies. CSW confirmed fax number and faxed referral to [phone]. [name] stated she will look over information and will call CSW back.   CSW has made the following attempts to contact all [place] DD/MI group home facilities:  [facility] [phone]: No answer. No VM setup.   [facility] [phone]: Left VM for Administrator Cephus Shellingimmy Rogers.    [facility] [phone]: Spoke with secretary Howell PringleEsther Mckenzie who stated she would let Jesse Mckenzie know a return call is needed.   [facility] [phone]: Attempted to call x3-fax number  [facility] [phone]: No bed vacancies per Administrator Jesse Mckenzie.   Jesse Mck[facility] [phone]: Left VM for Administrator Jesse Mckenzie  [facility] [phone]: No bed vacancies per Administrator Jesse Mckenzie.  [facility] [phone]: Left VM.   [facility] [phone]: No bed vacancies per Administrator Jesse Mckenzie  [facility] [phone]: No answer. No VM setup.   [facility] [phone]: Left VM.   [facility] [phone]: Attempted x3. Busy signal  Jesse Mckenzie [phone]: Fax number  [facility] [phone]: [facility]  [facility] [phone]: Left VM.   [facility] [phone]: Left VM.   [facility] [phone]: Busy signal  [facility]  [phone]: No bed vacancies per Risk analystsecretary Christy.   [facility] [phone]: Spoke to Sao Tome and PrincipeVeronica who will give message to her Administrator Janace Arisherry Crisp.   [facility] [phone]: Left VM for Administrator Jesse Mckenzie.   [facility] [name](Jesse Mckenzie) [phone]: Left VM.   [facility] [phone]: Wrong number  [facility] [phone]: Left VM for Administrator Jesse Mckenzie.   [facility] [phone]: Number not in service  [facility] [phone]: Number no in service  [facility] [phone]: Left VM  [facility] [phone]: Left VM  [facility] [phone]: Number disconnected  [facility] [phone]: Left VM  [facility] [phone]: Spoke to Jesse Mckenzie who will give message to Administrator Jesse SpineSheila Smith.   [facility] [phone]: No bed vacancies per NordstromDamain.   CSW and [name] to seek out facilities in other counties. CSW continuing to follow for discharge needs.   Corlis HoveJeneya Silus Lanzo, Theresia MajorsLCSWA, [facility] Clinical Social Worker-ED [phone]

## 2017-05-02 NOTE — ED Notes (Signed)
Report was received from Amy B., RN; Pt. Verbalizes  complaints of wanting to meet with the group home manager; distress concerning placement; denies S.I./Hi. Continue to monitor with 15 min. Monitoring.

## 2017-05-02 NOTE — ED Provider Notes (Signed)
-----------------------------------------   7:27 AM on 05/02/2017 -----------------------------------------   Blood pressure 140/82, pulse 100, temperature 97.8 F (36.6 C), temperature source Oral, resp. rate 18, height 5\' 7"  (1.702 m), weight 68 kg (150 lb), SpO2 100 %.  The patient had no acute events since last update.  Calm and cooperative at this time.  Disposition is pending Psychiatry/Behavioral Medicine team recommendations.     Willy Eddyobinson, Quamir Willemsen, MD 05/02/17 [phone]

## 2017-05-03 NOTE — ED Notes (Signed)
Pt. Alert and oriented, warm and dry, in no distress. Pt. Denies SI, HI, and AVH. Pt asking when he will be leaving. This Clinical research associatewriter told pt Child psychotherapistsocial worker and his guardian are working toward it. Once we find out he will be told but could not give time line. Pt. Encouraged to let nursing staff know of any concerns or needs.

## 2017-05-03 NOTE — ED Notes (Signed)

## 2017-05-03 NOTE — ED Notes (Signed)
Patient is alert and oriented. Patient is sad due to group home not visiting yesterday. Patient is calm and cooperative; he is taking medications as prescribed. Patient denies pain. Support and encouragement provided. Patient denies SI/HI and A/V hallucinations. Q 15 minute checks in progress and patient remains safe on unit. Monitoring continues.

## 2017-05-03 NOTE — ED Notes (Signed)
Patient took a shower at this time.

## 2017-05-03 NOTE — Clinical Social Work Note (Addendum)
CSW received a voicemail from Blythewoodyson of [facility], who stated he has no vacancies at this time. CSW updated psych MD.   CSW spoke with Birder RobsonJean Majors of [facility] at [phone], who stated she has reviewed pt's referral and [facility] within the next few hours to assess pt. CSW updated BHU RN Amy.   CSW has made the following attempts to contact out of county DD/MI group home facilities:  [facility] [phone]: Left another VM for The PNC Financialston Freeman.    [facility] [phone]: Left VM for Legrand RamsLashun Jones.  [facility] [phone]: Left VM.   [facility] [phone]: Spoke with Concepcion ElkKatherine Benton who stated pt would need an innovations waiver for the majority of their ([facility]) facilities.   [facility] [phone]: Faxed referral to Elease Hashimotoatricia at [phone]. Director Purvis Kiltsavid Humphries to review and get back to CSW.   [facility] [phone]: Faxed referral to [facility] at [phone]. Director Margaree Mackintoshenee Stewart to review and get back to CSW.   [facility] [phone]: Spoke with Lahoma RockerSherman who stated to call back between 7a-3:30p to speak with director.   CSW continuing to follow for discharge needs.   Corlis HoveJeneya Chaquetta Schlottman, Theresia MajorsLCSWA, [facility] Clinical Social Worker-ED [phone]

## 2017-05-03 NOTE — Consult Note (Signed)
Psychiatry: Brief update for Wednesday the 14th.  Spoke with patient reviewed chart spoke with nursing.  Spoke with social work.  21 year old man with autistic spectrum disorder remains in the emergency room because of placement difficulties.  Social work has not yet been able to locate a facility that will accept him for living.  Patient is frustrated but has not been acting out or aggressive.  He is able to be verbally redirected and understands the situation although he often needs it re-explained.  He is taking care of his basic health and hygiene appropriately.  Supportive counseling no change to medicine.  Continue to work with full treatment team on placement.

## 2017-05-03 NOTE — ED Notes (Signed)
Patient alert and verbal. Patient sitting in dayroom interacting with peers. Patient offered shower declined until after lunch. Patient in pleasant mood but continues to voice sadness of not being able to leave. Emotional support provided. Patient continues to be safe on unit and monitoring continues.

## 2017-05-04 NOTE — ED Notes (Signed)
Patient is alert and oriented to person and place.  Denies suicidal thoughts, pain, auditory and visual hallucinations.  Medications given as prescribed. Routine safety checks maintained.  Patient has been in bed sleeping.  No behavioral issues noted or reported.  Patient is safe on the unit.

## 2017-05-04 NOTE — ED Provider Notes (Signed)
-----------------------------------------   7:12 AM on 05/04/2017 -----------------------------------------   Blood pressure (!) 143/87, pulse (!) 109, temperature 98.4 F (36.9 C), temperature source Oral, resp. rate 18, height 5\' 7"  (1.702 m), weight 68 kg (150 lb), SpO2 100 %.  The patient had no acute events since last update.  Calm and cooperative at this time.  Disposition is pending Psychiatry/Behavioral Medicine team recommendations.     Sharman CheekStafford, Sussie Minor, MD 05/04/17 [phone]

## 2017-05-04 NOTE — ED Notes (Signed)
Patient ate 100% of supper and beverage.  

## 2017-05-04 NOTE — ED Notes (Signed)
PT  VOL/  PENDING  PLACEMENT 

## 2017-05-04 NOTE — ED Notes (Signed)

## 2017-05-04 NOTE — ED Notes (Signed)
Pt. Alert and oriented, warm and dry, in no distress. Pt. Denies SI, HI, and AVH. Pt. Encouraged to let nursing staff know of any concerns or needs. 

## 2017-05-04 NOTE — Clinical Social Work Note (Signed)
CSW received phone call from [facility] and they can not accept patient.  CSW attempted to contact [employer] at [phone] to see if they are following patient, left a message awaiting for call back.  CSW to continue to work on placement for patient.  Jesse KnackEric R. Eryn Mckenzie, MSW, Jesse MajorsLCSWA [phone]  05/04/2017 6:42 PM

## 2017-05-05 NOTE — ED Notes (Signed)

## 2017-05-05 NOTE — ED Notes (Signed)
VOL  PENDING  PLACEMENT 

## 2017-05-05 NOTE — ED Notes (Signed)
Pt. Alert and oriented, warm and dry, in no distress. Pt. Denies SI, HI, and AVH. Pt. Encouraged to let nursing staff know of any concerns or needs. 

## 2017-05-05 NOTE — ED Notes (Signed)
Nurse played cards with patient until Patient states "Im tired of cards it is making me hurt all over' nurse will continue to monitor. Patient is pleasant and no behavioral issues at this time.

## 2017-05-05 NOTE — ED Notes (Signed)
Patient is nursing station, nurse talked with him and He ask about leaving, nurse explained to him that there were referrals out for His placement, patient is calm and cooperative, q 15 minute checks and camera surveillance in progress for safety.

## 2017-05-05 NOTE — ED Notes (Signed)
Pt in room watching television. Pt verbalized"I'm going to chill until I get discharge". Pt offer shower, but refused. Will cont to monitor pt.

## 2017-05-06 NOTE — ED Notes (Signed)
VOL/Pending placement 

## 2017-05-06 NOTE — ED Notes (Signed)
Pt has been calm and cooperative. Pt is medication compliant. No behavioral or outburst noted/reported thus far this shift. Pt is currently lying down watching television. Remains on Q 15 mins safety rounds. Will cont to monitor pt.

## 2017-05-06 NOTE — ED Notes (Signed)
Report was received from Osie B., RN; Pt. Verbalizes  complaints of placement; and distress not having spoken to any group home staff; denies S.I./Hi. Continue to monitor with 15 min. Monitoring.

## 2017-05-06 NOTE — ED Notes (Signed)
Patient received PM snack. 

## 2017-05-06 NOTE — ED Notes (Signed)

## 2017-05-07 NOTE — ED Provider Notes (Signed)
-----------------------------------------   6:19 AM on 05/07/2017 -----------------------------------------   Blood pressure 118/72, pulse 100, temperature 98.8 F (37.1 C), temperature source Oral, resp. rate 18, height 5\' 7"  (1.702 m), weight 68 kg (150 lb), SpO2 100 %.  The patient had no acute events since last update.  Sleeping at this time.  Disposition is pending Psychiatry/Behavioral Medicine team recommendations.     Irean HongSung, Jade J, MD 05/07/17 [phone]

## 2017-05-07 NOTE — ED Notes (Signed)
Pt showered and brushed his teeth today. Sheets changes and bed wiped down. No behavioral issue thus far this shift. Will cont to monitor pt.

## 2017-05-07 NOTE — ED Notes (Signed)
Patient is voluntary and is pending placement. 

## 2017-05-08 NOTE — ED Notes (Signed)

## 2017-05-08 NOTE — ED Notes (Signed)
Pt sitting in dayroom speaking with other patients. No behavioral issues. Maintained on 15 minute checks and observation by security camera for safety.

## 2017-05-08 NOTE — Clinical Social Work Note (Addendum)
CSW followed up again with Barrington Ellison of [facility] at [phone], who stated shef was not able to assess pt yet, but will come today to [facility] to assess pt between 12p and 12:30pm. CSW asked that if Ms. Alda Lea is not able to make it at that time, to please call and notify CSW. CSW also spoke with Garvin Fila the [facility] manager who will be coming to assess pt today with Ms. Majors.   CSW left VM with [place] Social Worker [employer] at [phone]. CSW to continue to seek out facilities in other counties.   CSW met with Mr. Chana Bode after he assessed pt. Mr. Chana Bode states he will talk over with his director and pt's guardian and will let CSW know a decision. CSW provided Mr. Chana Bode with guardian's contact info and also updated guardian.  CSW met with pt in BHU common area to address pt's placement concerns. CSW informed pt that CSW is awaiting a decision from [facility] and will let pt know as soon as CSW is made aware. CSW continuing to follow for discharge needs.   Oretha Ellis, Latanya Presser, [place] Social Worker-ED [phone]

## 2017-05-08 NOTE — ED Notes (Signed)
Patient resting quietly in room. No noted distress or abnormal behaviors noted. Will continue 15 minute checks and observation by security camera for safety. 

## 2017-05-08 NOTE — ED Notes (Signed)
LCSW came to speak with patient and answer any questions about possible placement.  Pt accepting. Maintained on 15 minute checks and observation by security camera for safety.

## 2017-05-08 NOTE — ED Notes (Signed)
Patient asleep in room. No noted distress or abnormal behavior. Will continue 15 minute checks and observation by security cameras for safety. 

## 2017-05-08 NOTE — ED Notes (Addendum)
Jesse Mckenzie from [facility] is here to speak with patient. Pt is cooperative, happy to speak with the group home owner. Maintained on 15 minute checks and observation by security camera for safety.

## 2017-05-08 NOTE — ED Notes (Signed)
Pt taking a shower. No issues. Maintained on 15 minute checks and observation by security camera for safety.

## 2017-05-08 NOTE — ED Notes (Signed)
Pt. Alert and oriented, warm and dry, in no distress. Pt. Denies SI, HI, and AVH. Pt. Encouraged to let nursing staff know of any concerns or needs. 

## 2017-05-08 NOTE — ED Provider Notes (Signed)
-----------------------------------------   7:23 AM on 05/08/2017 -----------------------------------------   Blood pressure (!) 127/91, pulse (!) 105, temperature 98.3 F (36.8 C), temperature source Oral, resp. rate 18, height 5\' 7"  (1.702 m), weight 68 kg (150 lb), SpO2 100 %.  The patient had no acute events since last update.  Calm and cooperative at this time.  Disposition is pending Psychiatry/Behavioral Medicine team recommendations.     Willy Eddyobinson, Tim Wilhide, MD 05/08/17 [phone]

## 2017-05-08 NOTE — ED Notes (Signed)
Patient received PM snack. 

## 2017-05-09 ENCOUNTER — Emergency Department: Payer: Medicaid Other

## 2017-05-09 NOTE — ED Notes (Signed)
Pt  Vol  Pending  placement 

## 2017-05-09 NOTE — Clinical Social Work Note (Signed)
CSW received a call from [facility] [place] [facility] Social Criselda PeachesWorkerAmy Gillamat [phone], stating she received confirmation from Birder RobsonJean Majors that they will [facility] [place]. Ms. Gwinda PasseGilliam stated they will pick up pt on Wednesday, 05/10/17, around 1pm. Ms. Gwinda PasseGilliam stated facility requested pt receive a chest X-ray to confirm no TB. Ms. Gwinda PasseGilliam also stated she would complete admission paperwork today, as she will not be in the office on Wednesday or Thursday of this week. CSW provided update to RN Osie and EDP. CSWcontinuing to follow for discharge needs.   Corlis HoveJeneya Meiya Wisler, Theresia MajorsLCSWA, [facility] Clinical Social Worker-ED [phone]

## 2017-05-09 NOTE — ED Notes (Signed)
[facility]arn[facility] worker visit client. Visit was without incident. Will cont to monitor pt.

## 2017-05-09 NOTE — ED Notes (Signed)
Pt. Alert and oriented, warm and dry, in no distress. Pt. Denies SI, HI, and AVH. Pt. Encouraged to let nursing staff know of any concerns or needs. 

## 2017-05-09 NOTE — ED Notes (Signed)
Pt went for his xray. No behavioral issues. Return without incident.

## 2017-05-09 NOTE — ED Notes (Signed)
Pt. Knocked and window and indicated he wanted to talk.  Pt. Wanted to know about IM shots/flu shots/blood draws.  Pt. Was given verbal information. Pt. Happy with answers.

## 2017-05-09 NOTE — ED Notes (Signed)

## 2017-05-10 MED ORDER — DIVALPROEX SODIUM ER 500 MG PO TB24: 2000.0000 mg | ORAL_TABLET | Freq: Every day | ORAL | 1 refills | Status: DC

## 2017-05-10 MED ORDER — CLONAZEPAM 0.5 MG PO TABS: 0.5000 mg | ORAL_TABLET | Freq: Three times a day (TID) | ORAL | 1 refills | Status: DC

## 2017-05-10 MED ORDER — TRAZODONE HCL 50 MG PO TABS: 50.0000 mg | ORAL_TABLET | Freq: Every evening | ORAL | 1 refills | Status: AC | PRN

## 2017-05-10 MED ORDER — HALOPERIDOL 2 MG PO TABS: 4.0000 mg | ORAL_TABLET | Freq: Two times a day (BID) | ORAL | 1 refills | Status: DC

## 2017-05-10 MED ORDER — GABAPENTIN 100 MG PO CAPS: 100.0000 mg | ORAL_CAPSULE | Freq: Three times a day (TID) | ORAL | 1 refills | Status: DC

## 2017-05-10 MED ORDER — QUETIAPINE FUMARATE 300 MG PO TABS: 300.0000 mg | ORAL_TABLET | Freq: Every day | ORAL | 1 refills | Status: DC

## 2017-05-10 MED ORDER — BENZTROPINE MESYLATE 1 MG PO TABS: 1.0000 mg | ORAL_TABLET | Freq: Two times a day (BID) | ORAL | 1 refills | Status: DC

## 2017-05-10 NOTE — ED Notes (Signed)
Patient alert and oriented. He denies pain. No signs of aggressive behavior. He denies si, hi, avh. He is med compliant. He is discharged to the group home.

## 2017-05-10 NOTE — Consult Note (Signed)
[facility] Face-to-Face Psychiatry Consult 7  Reason for Consult:  Follow-up emergency room consult for this young man with autistic spectrum disorder who has been awaiting placement Referring Physician:  Derrill KayGoodman Patient Identification: Jesse Mckenzie Shimon MRN:  [id number] Principal Diagnosis: Autism Diagnosis:   Patient Active Problem List   Diagnosis Date Noted  . Autism [F84.0] 04/25/2017  . Developmental delay [R62.50] 04/25/2017  . Adjustment disorder with mixed disturbance of emotions and conduct [F43.25] 04/25/2017    Total Time spent with patient: 20 minutes  Subjective:   Jesse Mckenzie Garciamartinez is a 21 y.o. male patient admitted with "I hope that this is good".  HPI:  Patient seen and interviewed. Chart reviewed. Case reviewed with nursing with social work and with emergency room physician. See all previous notes. No new clinical change. This 21 year old man with history of autism has been waiting for quite a while here in our emergency room for new placement. Thanks to the hard work from social work staff a placement has been found. Patient is agreeable and enthusiastic. He has no new complaints. No behavior problems. Tolerating medicine well. Situation reviewed with social work. Patient will be needing prescriptions to insure transition to a new provider.  Past Psychiatric History: Patient has a past history of some agitation and behavior problems but has been doing fine here in the emergency room.  Risk to Self: Is patient at risk for suicide?: No Risk to Others:   Prior Inpatient Therapy:   Prior Outpatient Therapy:    Past Medical History:  Past Medical History:  Diagnosis Date  . Autism   . Developmental delay   . Impulsive personality disorder (HCC)   . Oppositional defiant disorder    History reviewed. No pertinent surgical history. Family History: No family history on file. Family Psychiatric  History: None known Social History:  Social History   Substance and Sexual Activity   Alcohol Use No  . Frequency: Never     Social History   Substance and Sexual Activity  Drug Use No    Social History   Socioeconomic History  . Marital status: Single    Spouse name: None  . Number of children: None  . Years of education: None  . Highest education level: None  Social Needs  . Financial resource strain: None  . Food insecurity - worry: None  . Food insecurity - inability: None  . Transportation needs - medical: None  . Transportation needs - non-medical: None  Occupational History  . None  Tobacco Use  . Smoking status: Never Smoker  . Smokeless tobacco: Never Used  Substance and Sexual Activity  . Alcohol use: No    Frequency: Never  . Drug use: No  . Sexual activity: No  Other Topics Concern  . None  Social History Narrative  . None   Additional Social History:    Allergies:  No Known Allergies  Labs: No results found for this or any previous visit (from the past 48 hour(s)).  Current Facility-Administered Medications  Medication Dose Route Frequency Provider Last Rate Last Dose  . benztropine (COGENTIN) tablet 1 mg  1 mg Oral BID Destry Bezdek, Jackquline DenmarkJohn T, MD   1 mg at 05/10/17 1029  . clonazePAM (KLONOPIN) tablet 0.5 mg  0.5 mg Oral TID Dyshawn Cangelosi T, MD   0.5 mg at 05/10/17 1029  . divalproex (DEPAKOTE ER) 24 hr tablet 2,000 mg  2,000 mg Oral QHS Katora Fini T, MD   2,000 mg at 05/09/17 2133  .  gabapentin (NEURONTIN) capsule 100 mg  100 mg Oral TID Vyom Brass T, MD   100 mg at 05/10/17 1029  . haloperidol (HALDOL) tablet 4 mg  4 mg Oral BID Bookert Guzzi, Jackquline DenmarkJohn T, MD   4 mg at 05/10/17 1029  . QUEtiapine (SEROQUEL) tablet 300 mg  300 mg Oral QHS Edwar Coe T, MD   300 mg at 05/09/17 2133  . traZODone (DESYREL) tablet 50 mg  50 mg Oral QHS PRN Sedonia Kitner, Jackquline DenmarkJohn T, MD   50 mg at 05/09/17 2133   Current Outpatient Medications  Medication Sig Dispense Refill  . benztropine (COGENTIN) 1 MG tablet Take 1 tablet (1 mg total) by mouth 2 (two) times daily.  60 tablet 1  . clonazePAM (KLONOPIN) 0.5 MG tablet Take 1 tablet (0.5 mg total) by mouth 3 (three) times daily. 90 tablet 1  . divalproex (DEPAKOTE ER) 500 MG 24 hr tablet Take 4 tablets (2,000 mg total) by mouth at bedtime. 120 tablet 1  . gabapentin (NEURONTIN) 100 MG capsule Take 1 capsule (100 mg total) by mouth 3 (three) times daily. 90 capsule 1  . haloperidol (HALDOL) 2 MG tablet Take 2 tablets (4 mg total) by mouth 2 (two) times daily. 120 tablet 1  . QUEtiapine (SEROQUEL) 300 MG tablet Take 1 tablet (300 mg total) by mouth at bedtime. 30 tablet 1  . traZODone (DESYREL) 50 MG tablet Take 1 tablet (50 mg total) by mouth at bedtime as needed for sleep. 30 tablet 1    Musculoskeletal: Strength & Muscle Tone: within normal limits Gait & Station: normal Patient leans: N/A  Psychiatric Specialty Exam: Physical Exam  Nursing note and vitals reviewed. Constitutional: He appears well-developed and well-nourished.  HENT:  Head: Normocephalic and atraumatic.  Eyes: Conjunctivae are normal. Pupils are equal, round, and reactive to light.  Neck: Normal range of motion.  Cardiovascular: Regular rhythm and normal heart sounds.  Respiratory: Effort normal. No respiratory distress.  GI: Soft.  Musculoskeletal: Normal range of motion.  Neurological: He is alert.  Skin: Skin is warm and dry.  Psychiatric: He has a normal mood and affect. His speech is normal and behavior is normal. Judgment and thought content normal. Cognition and memory are impaired.    Review of Systems  Constitutional: Negative.   HENT: Negative.   Eyes: Negative.   Respiratory: Negative.   Cardiovascular: Negative.   Gastrointestinal: Negative.   Musculoskeletal: Negative.   Skin: Negative.   Neurological: Negative.   Psychiatric/Behavioral: Negative.     Blood pressure 133/76, pulse (!) 108, temperature 98.5 F (36.9 C), resp. rate 18, height 5\' 7"  (1.702 m), weight 68 kg (150 lb), SpO2 100 %.Body mass index is  23.49 kg/m.  General Appearance: Fairly Groomed  Eye Contact:  Good  Speech:  Clear and Coherent  Volume:  Decreased  Mood:  Euthymic  Affect:  Appropriate  Thought Process:  Goal Directed  Orientation:  Full (Time, Place, and Person)  Thought Content:  Logical  Suicidal Thoughts:  No  Homicidal Thoughts:  No  Memory:  Immediate;   Fair Recent;   Fair Remote;   Fair  Judgement:  Fair  Insight:  Fair  Psychomotor Activity:  Normal  Concentration:  Concentration: Fair  Recall:  [employer]Fair  Fund of Knowledge:  Fair  Language:  Fair  Akathisia:  No  Handed:  Right  AIMS (if indicated):     Assets:  Communication Skills Desire for Improvement Financial Resources/Insurance Physical Health Resilience  ADL's:  Intact  Cognition:  Impaired,  Mild  Sleep:        Treatment Plan Summary: Medication management and Plan When he 74-year-old man with a history of autistic spectrum disorder and 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 He has a seemingly appropriate discharge plan in place. I have printed out prescriptions for his current medicines for a month with another refill. Patient has been off commitment. He will be discharged from the emergency room and follow-up with local providers.  Disposition: No evidence of imminent risk to self or others at present.   Patient does not meet criteria for psychiatric inpatient admission. Supportive therapy provided about ongoing stressors.  Mordecai Rasmussen, MD 05/10/2017 12:20 PM

## 2017-05-10 NOTE — Clinical Social Work Note (Signed)
CSW spoke with Birder RobsonJean Majors of [facility] at [phone], to confirm pt's pick up time for today. Ms. Alysia PennaMajors stated Edwena BlowBillie Coleman will pick up pt around 1pm. CSW to update MD and BHU RN. Rosiland OzGuardian-Amy Gilliam has completed admission paperwork an dis aware of discharge plan. CSW signing off as no further Social Work needs identified.   Corlis HoveJeneya Valdez Brannan, Theresia MajorsLCSWA, [facility] Clinical Social Worker-ED [phone]

## 2017-05-10 NOTE — ED Provider Notes (Signed)
-----------------------------------------   6:55 AM on 05/10/2017 -----------------------------------------   Blood pressure 133/76, pulse (!) 108, temperature 98.5 F (36.9 C), resp. rate 18, height 1.702 m (5\' 7" ), weight 68 kg (150 lb), SpO2 100 %.  The patient had no acute events since last update.  Calm and cooperative at this time.  Disposition is pending placement in a group home, according to Dr. Toni Amendlapacs he does not meet inpatient criteria     Loleta RoseForbach, Anginette Espejo, MD 05/10/17 [phone]

## 2017-06-19 ENCOUNTER — Ambulatory Visit: Payer: Self-pay | Admitting: Physician Assistant

## 2017-06-19 ENCOUNTER — Encounter: Payer: Self-pay | Admitting: Emergency Medicine

## 2017-06-19 ENCOUNTER — Emergency Department
Admission: EM | Admit: 2017-06-19 | Discharge: 2017-06-20 | Disposition: A | Discharge status: Home / Self Care | Payer: Medicaid Other | Attending: Emergency Medicine | Admitting: Emergency Medicine

## 2017-06-19 DIAGNOSIS — F840 Autistic disorder: Secondary | ICD-10-CM | POA: Insufficient documentation

## 2017-06-19 DIAGNOSIS — R45851 Suicidal ideations: Secondary | ICD-10-CM | POA: Insufficient documentation

## 2017-06-19 DIAGNOSIS — F88 Other disorders of psychological development: Secondary | ICD-10-CM | POA: Insufficient documentation

## 2017-06-19 DIAGNOSIS — F4325 Adjustment disorder with mixed disturbance of emotions and conduct: Secondary | ICD-10-CM | POA: Insufficient documentation

## 2017-06-19 DIAGNOSIS — F639 Impulse disorder, unspecified: Secondary | ICD-10-CM | POA: Insufficient documentation

## 2017-06-19 DIAGNOSIS — Z79899 Other long term (current) drug therapy: Secondary | ICD-10-CM | POA: Insufficient documentation

## 2017-06-19 DIAGNOSIS — Z046 Encounter for general psychiatric examination, requested by authority: Secondary | ICD-10-CM | POA: Diagnosis present

## 2017-06-19 LAB — COMPREHENSIVE METABOLIC PANEL
ALBUMIN: 4.5 g/dL (ref 3.5–5.0)
ALK PHOS: 59 U/L (ref 38–126)
ALT: 64 U/L — ABNORMAL HIGH (ref 17–63)
ANION GAP: 10 (ref 5–15)
AST: 47 U/L — ABNORMAL HIGH (ref 15–41)
BILIRUBIN TOTAL: 0.5 mg/dL (ref 0.3–1.2)
BUN: 10 mg/dL (ref 6–20)
CALCIUM: 9.8 mg/dL (ref 8.9–10.3)
CO2: 27 mmol/L (ref 22–32)
Chloride: 98 mmol/L — ABNORMAL LOW (ref 101–111)
Creatinine, Ser: 0.85 mg/dL (ref 0.61–1.24)
GFR calc non Af Amer: >60 mL/min (ref >60)
Glucose, Bld: 201 mg/dL — ABNORMAL HIGH (ref 65–99)
POTASSIUM: 3.8 mmol/L (ref 3.5–5.1)
SODIUM: 135 mmol/L (ref 135–145)
TOTAL PROTEIN: 8 g/dL (ref 6.5–8.1)

## 2017-06-19 LAB — CBC
HCT: 41 % (ref 40.0–52.0)
Hemoglobin: 14.1 g/dL (ref 13.0–18.0)
MCH: 32.6 pg (ref 26.0–34.0)
MCHC: 34.4 g/dL (ref 32.0–36.0)
MCV: 94.7 fL (ref 80.0–100.0)
PLATELETS: 155 10*3/uL (ref 150–440)
RBC: 4.33 MIL/uL — ABNORMAL LOW (ref 4.40–5.90)
RDW: 12 % (ref 11.5–14.5)
WBC: 5.3 10*3/uL (ref 3.8–10.6)

## 2017-06-19 LAB — ETHANOL: Alcohol, Ethyl (B): <10 mg/dL (ref <10)

## 2017-06-19 LAB — ACETAMINOPHEN LEVEL

## 2017-06-19 LAB — SALICYLATE LEVEL

## 2017-06-19 NOTE — ED Notes (Signed)
EDMD at bedside
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
## 2017-06-20 NOTE — ED Notes (Signed)

## 2017-06-20 NOTE — ED Notes (Addendum)
ED  Is the patient under IVC or is there intent for IVC:  No - rescinded  Is the patient medically cleared: Yes.   Is there vacancy in the ED BHU: Yes.   Is the population mix appropriate for patient: Yes.   Is the patient awaiting placement in inpatient or outpatient setting:  Pending discharge  Has the patient had a psychiatric consult: Yes.   Survey of unit performed for contraband, proper placement and condition of furniture, tampering with fixtures in bathroom, shower, and each patient room: Yes.  ; Findings:  APPEARANCE/BEHAVIOR Calm and cooperative NEURO ASSESSMENT Orientation: oriented x3  Denies pain Hallucinations: No.None noted (Hallucinations) denies at this time Speech: Normal Gait: normal RESPIRATORY ASSESSMENT Even  Unlabored respirations  CARDIOVASCULAR ASSESSMENT Pulses equal   regular rate  Skin warm and dry   GASTROINTESTINAL ASSESSMENT no GI complaint EXTREMITIES Full ROM  PLAN OF CARE Provide calm/safe environment. Vital signs assessed twice daily. ED BHU Assessment once each 12-hour shift. Collaborate with TTS daily or as condition indicates. Assure the ED provider has rounded once each shift. Provide and encourage hygiene. Provide redirection as needed. Assess for escalating behavior; address immediately and inform ED provider.  Assess family dynamic and appropriateness for visitation as needed: Yes.  ; If necessary, describe findings:  Educate the patient/family about BHU procedures/visitation: Yes.  ; If necessary, describe findings:

## 2017-06-20 NOTE — ED Notes (Signed)
Patient observed lying in bed with eyes closed  Even, unlabored respirations observed   NAD pt appears to be sleeping  I will continue to monitor along with every 15 minute visual observations and ongoing security monitoring    

## 2017-06-20 NOTE — ED Notes (Signed)
Pt moved from 19H to room 21, [facility]  set up in room 21 for consult, pt calm, cooperative no distress noted.

## 2017-06-20 NOTE — ED Notes (Signed)
SOC Dr. Thomasena Edisollins called Rn reports he is sending reversal papers to discharge pt back to group home

## 2017-06-20 NOTE — ED Notes (Signed)
Meal tray placed in pt rm, pt still asleep.

## 2017-06-20 NOTE — ED Notes (Signed)
BEHAVIORAL HEALTH ROUNDING Patient sleeping: No. Patient alert and oriented: yes Behavior appropriate: Yes.  ; If no, describe:  Nutrition and fluids offered: yes Toileting and hygiene offered: Yes  Sitter present: q15 minute observations and security  monitoring Law enforcement present: Yes  ODS  

## 2017-06-20 NOTE — ED Notes (Signed)
Plan of care discussed  - assessment completed - he is discharged but I have been unable to reach anyone at the group home today - pt verbalizes a desire to go back to the group home  "I feel better - last night was bad but today is better"   Continue to monitor

## 2017-06-20 NOTE — ED Notes (Signed)
[facility], [facility]

## 2017-06-20 NOTE — BH Assessment (Addendum)
TTS called the number on file for [facility] [phone], person who answered stated this was an incorrect number. Attempting to facilitate transportation as pt is currently discharged.    TTS called pt's legal guardian Jesse Mckenzie(Amy Gilliam [phone],. Per answering machine she will be out of the office until Jan 2nd, 2019. Message requesting a return call was left.

## 2017-06-28 ENCOUNTER — Emergency Department
Admission: EM | Admit: 2017-06-28 | Discharge: 2017-06-29 | Disposition: A | Discharge status: Other Acute Inpatient Hospital | Payer: Medicaid Other | Attending: Emergency Medicine | Admitting: Emergency Medicine

## 2017-06-28 ENCOUNTER — Encounter: Payer: Self-pay | Admitting: Emergency Medicine

## 2017-06-28 DIAGNOSIS — R4689 Other symptoms and signs involving appearance and behavior: Secondary | ICD-10-CM | POA: Insufficient documentation

## 2017-06-28 DIAGNOSIS — F913 Oppositional defiant disorder: Secondary | ICD-10-CM | POA: Diagnosis not present

## 2017-06-28 DIAGNOSIS — Z79899 Other long term (current) drug therapy: Secondary | ICD-10-CM | POA: Diagnosis not present

## 2017-06-28 DIAGNOSIS — F4325 Adjustment disorder with mixed disturbance of emotions and conduct: Secondary | ICD-10-CM | POA: Diagnosis present

## 2017-06-28 DIAGNOSIS — F840 Autistic disorder: Secondary | ICD-10-CM | POA: Diagnosis present

## 2017-06-28 DIAGNOSIS — R45851 Suicidal ideations: Secondary | ICD-10-CM | POA: Insufficient documentation

## 2017-06-28 DIAGNOSIS — R456 Violent behavior: Secondary | ICD-10-CM | POA: Diagnosis present

## 2017-06-28 DIAGNOSIS — R739 Hyperglycemia, unspecified: Secondary | ICD-10-CM

## 2017-06-28 LAB — COMPREHENSIVE METABOLIC PANEL
ALBUMIN: 4.4 g/dL (ref 3.5–5.0)
ALK PHOS: 70 U/L (ref 38–126)
ALT: 53 U/L (ref 17–63)
AST: 40 U/L (ref 15–41)
Anion gap: 10 (ref 5–15)
BILIRUBIN TOTAL: 0.3 mg/dL (ref 0.3–1.2)
BUN: 8 mg/dL (ref 6–20)
CO2: 29 mmol/L (ref 22–32)
CREATININE: 1.07 mg/dL (ref 0.61–1.24)
Calcium: 9.7 mg/dL (ref 8.9–10.3)
Chloride: 99 mmol/L — ABNORMAL LOW (ref 101–111)
GFR calc Af Amer: >60 mL/min (ref >60)
GFR calc non Af Amer: >60 mL/min (ref >60)
GLUCOSE: 323 mg/dL — AB (ref 65–99)
Potassium: 4.5 mmol/L (ref 3.5–5.1)
Sodium: 138 mmol/L (ref 135–145)
TOTAL PROTEIN: 8.1 g/dL (ref 6.5–8.1)

## 2017-06-28 LAB — ETHANOL: Alcohol, Ethyl (B): <10 mg/dL (ref <10)

## 2017-06-28 LAB — CBC
HEMATOCRIT: 43.2 % (ref 40.0–52.0)
Hemoglobin: 14.4 g/dL (ref 13.0–18.0)
MCH: 32 pg (ref 26.0–34.0)
MCHC: 33.5 g/dL (ref 32.0–36.0)
MCV: 95.7 fL (ref 80.0–100.0)
Platelets: 145 10*3/uL — ABNORMAL LOW (ref 150–440)
RBC: 4.51 MIL/uL (ref 4.40–5.90)
RDW: 12 % (ref 11.5–14.5)
WBC: 5.3 10*3/uL (ref 3.8–10.6)

## 2017-06-28 LAB — URINE DRUG SCREEN, QUALITATIVE (ARMC ONLY)
AMPHETAMINES, UR SCREEN: NOT DETECTED
BENZODIAZEPINE, UR SCRN: NOT DETECTED
Barbiturates, Ur Screen: NOT DETECTED
Cannabinoid 50 Ng, Ur ~~LOC~~: NOT DETECTED
Cocaine Metabolite,Ur ~~LOC~~: NOT DETECTED
MDMA (Ecstasy)Ur Screen: NOT DETECTED
Methadone Scn, Ur: NOT DETECTED
OPIATE, UR SCREEN: NOT DETECTED
PHENCYCLIDINE (PCP) UR S: NOT DETECTED
Tricyclic, Ur Screen: NOT DETECTED

## 2017-06-28 LAB — SALICYLATE LEVEL: Salicylate Lvl: <7 mg/dL (ref 2.8–30.0)

## 2017-06-28 LAB — ACETAMINOPHEN LEVEL: Acetaminophen (Tylenol), Serum: <10 ug/mL — ABNORMAL LOW (ref 10–30)

## 2017-06-28 MED ORDER — DIVALPROEX SODIUM ER 500 MG PO TB24
2000.0000 mg | ORAL_TABLET | Freq: Every day | ORAL | Status: DC
  Administered 2017-06-28: 2000 mg via ORAL
  Filled 2017-06-28: qty 8

## 2017-06-28 MED ORDER — CLONAZEPAM 0.5 MG PO TABS
0.5000 mg | ORAL_TABLET | Freq: Three times a day (TID) | ORAL | Status: DC
  Administered 2017-06-28 – 2017-06-29 (×2): 0.5 mg via ORAL
  Filled 2017-06-28 (×2): qty 1

## 2017-06-28 MED ORDER — BENZTROPINE MESYLATE 1 MG PO TABS
1.0000 mg | ORAL_TABLET | Freq: Two times a day (BID) | ORAL | Status: DC
Start: 2017-06-28 — End: 2017-06-29
  Administered 2017-06-28 – 2017-06-29 (×2): 1 mg via ORAL
  Filled 2017-06-28 (×2): qty 1

## 2017-06-28 MED ORDER — GABAPENTIN 100 MG PO CAPS
100.0000 mg | ORAL_CAPSULE | Freq: Three times a day (TID) | ORAL | Status: DC
  Administered 2017-06-28 – 2017-06-29 (×2): 100 mg via ORAL
  Filled 2017-06-28 (×2): qty 1

## 2017-06-28 MED ORDER — HALOPERIDOL 0.5 MG PO TABS
4.0000 mg | ORAL_TABLET | Freq: Two times a day (BID) | ORAL | Status: DC
  Administered 2017-06-28 – 2017-06-29 (×2): 4 mg via ORAL
  Filled 2017-06-28: qty 8
  Filled 2017-06-28: qty 2

## 2017-06-28 MED ORDER — QUETIAPINE FUMARATE 25 MG PO TABS
300.0000 mg | ORAL_TABLET | Freq: Every day | ORAL | Status: DC
  Administered 2017-06-28: 300 mg via ORAL
  Filled 2017-06-28: qty 1

## 2017-06-28 NOTE — ED Notes (Signed)
[facility]  Pt. Familiar with unit.  Pt. Advised of 15 minute checks and video monitoring.  Pt. Requested and given drink and remote for tv.
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
## 2017-06-28 NOTE — ED Notes (Signed)
Pt dressed out. Pt belonging bag:  Wallet with no cash, keys, shoes, watch, headphones, shorts, jeans, belt, socks, jacket, 2 shirts, briefs. Pt bag handed off to [place] [facility].

## 2017-06-29 ENCOUNTER — Other Ambulatory Visit: Payer: Self-pay

## 2017-06-29 ENCOUNTER — Inpatient Hospital Stay
Admission: AD | Admit: 2017-06-29 | Discharge: 2017-07-04 | DRG: 882 | Disposition: A | Discharge status: Home / Self Care | Payer: Medicaid Other | Attending: Psychiatry | Admitting: Psychiatry

## 2017-06-29 ENCOUNTER — Ambulatory Visit: Payer: Self-pay | Admitting: Physician Assistant

## 2017-06-29 DIAGNOSIS — R4689 Other symptoms and signs involving appearance and behavior: Secondary | ICD-10-CM | POA: Diagnosis not present

## 2017-06-29 DIAGNOSIS — F840 Autistic disorder: Secondary | ICD-10-CM | POA: Diagnosis present

## 2017-06-29 DIAGNOSIS — R45851 Suicidal ideations: Secondary | ICD-10-CM | POA: Diagnosis present

## 2017-06-29 DIAGNOSIS — F4325 Adjustment disorder with mixed disturbance of emotions and conduct: Secondary | ICD-10-CM

## 2017-06-29 DIAGNOSIS — E119 Type 2 diabetes mellitus without complications: Secondary | ICD-10-CM | POA: Diagnosis present

## 2017-06-29 DIAGNOSIS — F419 Anxiety disorder, unspecified: Secondary | ICD-10-CM | POA: Diagnosis present

## 2017-06-29 DIAGNOSIS — R739 Hyperglycemia, unspecified: Secondary | ICD-10-CM

## 2017-06-29 LAB — GLUCOSE, CAPILLARY
GLUCOSE-CAPILLARY: 406 mg/dL — AB (ref 65–99)
Glucose-Capillary: 217 mg/dL — ABNORMAL HIGH (ref 65–99)
Glucose-Capillary: 336 mg/dL — ABNORMAL HIGH (ref 65–99)
Glucose-Capillary: 368 mg/dL — ABNORMAL HIGH (ref 65–99)

## 2017-06-29 LAB — HEMOGLOBIN A1C
Hgb A1c MFr Bld: 8.2 % — ABNORMAL HIGH (ref 4.8–5.6)
MEAN PLASMA GLUCOSE: 188.64 mg/dL

## 2017-06-29 MED ORDER — INSULIN ASPART 100 UNIT/ML ~~LOC~~ SOLN
0.0000 [IU] | Freq: Three times a day (TID) | SUBCUTANEOUS | Status: DC
  Administered 2017-06-30 (×2): 2 [IU] via SUBCUTANEOUS
  Administered 2017-06-30: 1 [IU] via SUBCUTANEOUS
  Administered 2017-07-01: 2 [IU] via SUBCUTANEOUS
  Administered 2017-07-01 – 2017-07-03 (×5): 1 [IU] via SUBCUTANEOUS
  Filled 2017-06-29 (×9): qty 1

## 2017-06-29 MED ORDER — BENZTROPINE MESYLATE 1 MG PO TABS
1.0000 mg | ORAL_TABLET | Freq: Two times a day (BID) | ORAL | Status: DC
  Administered 2017-06-29 – 2017-07-04 (×10): 1 mg via ORAL
  Filled 2017-06-29 (×10): qty 1

## 2017-06-29 MED ORDER — TRAZODONE HCL 100 MG PO TABS: 100.0000 mg | ORAL_TABLET | Freq: Every day | ORAL | Status: DC

## 2017-06-29 MED ORDER — DIVALPROEX SODIUM ER 500 MG PO TB24
2000.0000 mg | ORAL_TABLET | Freq: Every day | ORAL | Status: DC
  Administered 2017-06-29 – 2017-07-03 (×5): 2000 mg via ORAL
  Filled 2017-06-29 (×5): qty 4

## 2017-06-29 MED ORDER — CLONAZEPAM 0.5 MG PO TABS
0.5000 mg | ORAL_TABLET | Freq: Three times a day (TID) | ORAL | Status: DC
  Administered 2017-06-29 – 2017-07-04 (×15): 0.5 mg via ORAL
  Filled 2017-06-29 (×15): qty 1

## 2017-06-29 MED ORDER — TRAZODONE HCL 100 MG PO TABS
100.0000 mg | ORAL_TABLET | Freq: Every day | ORAL | Status: DC
  Administered 2017-06-29: 100 mg via ORAL
  Filled 2017-06-29: qty 1

## 2017-06-29 MED ORDER — METFORMIN HCL 500 MG PO TABS
500.0000 mg | ORAL_TABLET | Freq: Two times a day (BID) | ORAL | Status: DC
  Administered 2017-06-30 – 2017-07-03 (×7): 500 mg via ORAL
  Filled 2017-06-29 (×7): qty 1

## 2017-06-29 MED ORDER — ACETAMINOPHEN 325 MG PO TABS: 650.0000 mg | ORAL_TABLET | Freq: Four times a day (QID) | ORAL | Status: DC | PRN

## 2017-06-29 MED ORDER — MAGNESIUM HYDROXIDE 400 MG/5ML PO SUSP: 30.0000 mL | Freq: Every day | ORAL | Status: DC | PRN

## 2017-06-29 MED ORDER — GABAPENTIN 100 MG PO CAPS
100.0000 mg | ORAL_CAPSULE | Freq: Three times a day (TID) | ORAL | Status: DC
  Administered 2017-06-29 – 2017-07-04 (×15): 100 mg via ORAL
  Filled 2017-06-29 (×15): qty 1

## 2017-06-29 MED ORDER — ALUM & MAG HYDROXIDE-SIMETH 200-200-20 MG/5ML PO SUSP: 30.0000 mL | ORAL | Status: DC | PRN

## 2017-06-29 NOTE — Tx Team (Signed)
Initial Treatment Plan 06/29/2017 5:57 PM Jesse Mckenzie ZOX:[id number]    PATIENT STRESSORS: Marital or family conflict Traumatic event   PATIENT STRENGTHS: Motivation for treatment/growth Supportive family/friends   PATIENT IDENTIFIED PROBLEMS: Elevated Blood Sugars  Anxiety                   DISCHARGE CRITERIA:  Adequate post-discharge living arrangements Improved stabilization in mood, thinking, and/or behavior  PRELIMINARY DISCHARGE PLAN: Return to previous living arrangement  PATIENT/FAMILY INVOLVEMENT: This treatment plan has been presented to and reviewed with the patient, Jesse Mckenzie, and/or family member.  The patient and family have been given the opportunity to ask questions and make suggestions.  Jim DesanctisYawn L Antwyne Pingree, RN 06/29/2017, 5:57 PM

## 2017-06-29 NOTE — ED Notes (Signed)
Consult completed/ IVC rescinded/ Stan HeadAmanda S. RN & ODS officer K.Kipp BroodBrent are aware
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
## 2017-06-29 NOTE — ED Notes (Signed)
Dr. Toni Amendlapacs requested CBG. CBG completed by tech, 217. Reported to Dr. Toni Amendlapacs. Will continue to monitor.

## 2017-06-29 NOTE — ED Notes (Signed)
ED Charge RN informed of need for tech to come draw Hemoglobin A1c lab, reported she would send someone.
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
## 2017-06-29 NOTE — Clinical Social Work Note (Signed)
CSW [facility] a [facility]/SW Mindi Curlingmy Gillam at [phone] asking for update. [name] stated is group home is willing to accept pt back after treatment. [name] is requesting a review of pt's meds be completed and is available to answer questions as needed. CSW signing off as no further Social Work needs identified.   Corlis HoveJeneya Sheng Pritz, Theresia MajorsLCSWA, [facility] Clinical Social Worker-ED [phone]

## 2017-06-29 NOTE — ED Notes (Signed)
Pt cooperative with blood draw by lab tech. Asks about discharge, but easily redirected when this nurse explained blood glucose elevation and need to remain in hospital until under control per Dr. Toni Amendlapacs. Voiced understanding. Safety maintained. Will continue to monitor.

## 2017-06-29 NOTE — [facility] Group Notes (Signed)
[facility] Group Notes:  (Nursing/MHT/Case Management/Adjunct)  Date:  06/29/2017  Time:  6:06 PM  Type of Therapy:  Psychoeducational Skills  Participation Level:  Did Not Attend    Judene CompanionMary  Neill Jurewicz 06/29/2017, 6:06 PM

## 2017-06-29 NOTE — ED Notes (Signed)
Patient placed back on IVC by Dr.Clapacs @ 1600/ Stan HeadAmanda S. RN & ODS officer K.Dixon are aware

## 2017-06-29 NOTE — ED Notes (Signed)
Pt asleep, with even/unlabored respirations observed. Safety maintained with every 15 minute checks and security cameras in place. Will continue to monitor.

## 2017-06-29 NOTE — ED Notes (Signed)
Woke pt for morning medications. Compliant with taking meds as ordered. Cooperative at this time without requests. Safety maintained with every 15 minute checks and security cameras in place. Will continue to monitor.

## 2017-06-29 NOTE — Plan of Care (Signed)
Goals not met at this time, patient newly admitted
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
## 2017-06-29 NOTE — ED Notes (Signed)
Pt asking if he is allowed to go back to his group home as he would like to on discharge because "I like that group home." Pt also asking when he will see Dr. Toni Amendlapacs. Informed pt he is on unit to round. Pleasant, calm and cooperative at this time. Safety maintained. Will continue to monitor.

## 2017-06-29 NOTE — ED Notes (Signed)
Belongings taken with patient to [facility] for admission. Report given to Hulan AmatoGwen Farrish, [place]. Pt to be transported with this nurse and officer present. Safety maintained.

## 2017-06-29 NOTE — ED Provider Notes (Signed)
Discussed face-to-face with psychiatrist Dr. Toni Amendlapacs.  Patient with elevated blood sugars, no history of diabetes, on Seroquel, concerning that this hyperglycemia is due to Seroquel.  He is recommending hospitalization to change the patient off of Seroquel to something else, medication stabilization.   Governor RooksLord, Beryle Zeitz, MD 06/29/17 1153

## 2017-06-29 NOTE — BH Assessment (Signed)
[facility] by [facility]. T[facility] Amendlapacs.  Attending Physician will be Dr. Johnella MoloneyMcNew.   Patient [facility] Gwen.   Intake Paper Work has been signed and placed on patient chart.  ( ER staff is aware of the admission Glenda the ER Sect.; Dr. Shaune PollackLord the ER MD; Marchelle FolksAmanda the Patient's Nurse & Ivin BootyJoshua (Patient Access).

## 2017-06-30 DIAGNOSIS — F4325 Adjustment disorder with mixed disturbance of emotions and conduct: Principal | ICD-10-CM

## 2017-06-30 DIAGNOSIS — F840 Autistic disorder: Secondary | ICD-10-CM

## 2017-06-30 LAB — GLUCOSE, CAPILLARY
GLUCOSE-CAPILLARY: 164 mg/dL — AB (ref 65–99)
Glucose-Capillary: 165 mg/dL — ABNORMAL HIGH (ref 65–99)
Glucose-Capillary: 125 mg/dL — ABNORMAL HIGH (ref 65–99)
Glucose-Capillary: 150 mg/dL — ABNORMAL HIGH (ref 65–99)

## 2017-06-30 LAB — HEMOGLOBIN A1C
HEMOGLOBIN A1C: 8.2 % — AB (ref 4.8–5.6)
Mean Plasma Glucose: 188.64 mg/dL

## 2017-06-30 MED ORDER — HALOPERIDOL 2 MG PO TABS
4.0000 mg | ORAL_TABLET | Freq: Two times a day (BID) | ORAL | Status: DC
  Administered 2017-06-30 – 2017-07-03 (×6): 4 mg via ORAL
  Filled 2017-06-30 (×7): qty 2

## 2017-06-30 MED ORDER — TRAZODONE HCL 50 MG PO TABS
50.0000 mg | ORAL_TABLET | Freq: Every evening | ORAL | Status: DC | PRN
  Administered 2017-07-01 – 2017-07-04 (×3): 50 mg via ORAL
  Filled 2017-06-30 (×3): qty 1

## 2017-06-30 NOTE — Progress Notes (Signed)
Pleasant, childlike, due to ASD, CBG monitored throughout shift and insulin administered per sliding scale coverage. Patient is present in the milieu and is  friendly with other patients. Repeatedly asking me when he will be discharged; no temper tantrums exhibited, denied SI/HI/AVH. Milieu remains safe with q 15 minute safety checks.

## 2017-06-30 NOTE — Progress Notes (Signed)
Recreation Therapy Notes  INPATIENT RECREATION THERAPY ASSESSMENT  Patient Details Name: Jesse Mckenzie MRN: [id number] DOB: 05/10/1996 Today's Date: 06/30/2017   Unable to wake patient to finish assessment.   Patient Stressors: Other (Comment)(Health)  Coping Skills:   Exercise, Talking, Music, Other (Comment)(Journaling)  Personal Challenges:    Leisure Interests (2+):     Awareness of Community Resources:     [facility] Resources:     Current Use:    If no, Barriers?:    Patient Strengths:     Patient Identified Areas of Improvement:     Current Recreation Participation:     Patient Goal for Hospitalization:  Control impulses  [place]      Current SI (including self-harm):     Current HI:     Consent to Intern Participation:     Adreanna Fickel 06/30/2017, 3:44 PM

## 2017-06-30 NOTE — Progress Notes (Signed)
Patient ID: Jesse Mckenzie R January, male   DOB: 09/12/1995, 22 y.o.   MRN: [id number] Pleasant, childlike, due to ASD, CBG=406 @2024 , Dr. Toni Amendlapacs returned my page and asked a repeat of CBG; which, at 2104=368. Dr. Toni Amendlapacs put in new orders to manage hyperglycemic events. Patient is friendly with other patients easily, disheveled, repeatedly asking me when he will be discharged; no temper tantrums, denied SI/HI/AVH

## 2017-06-30 NOTE — BHH Suicide Risk Assessment (Signed)
[facility]   Nursing information obtained from:  Family(verbally denies SI) Demographic factors:  Male, Low socioeconomic status Current Mental Status:  NA Loss Factors:  NA Historical Factors:  NA Risk Reduction Factors:  NA  Total Time spent with patient: 45 minutes Principal Problem: Adjustment disorder with mixed disturbance of emotions and conduct Diagnosis:   Patient Active Problem List   Diagnosis Date Noted  . Autism [F84.0] 04/25/2017    Priority: High  . Adjustment disorder with mixed disturbance of emotions and conduct [F43.25] 04/25/2017    Priority: High  . Elevated blood sugar [R73.9] 06/29/2017  . Developmental delay [R62.50] 04/25/2017   Subjective Data: See H&P  Continued Clinical Symptoms:  Alcohol Use Disorder Identification Test Final Score (AUDIT): 0 The "Alcohol Use Disorders Identification Test", Guidelines for Use in Primary Care, Second Edition.  World Science writerHealth Organization [facility](WHO). Score between 0-7:  no or low risk or alcohol related problems. Score between 8-15:  moderate risk of alcohol related problems. Score between 16-19:  high risk of alcohol related problems. Score 20 or above:  warrants further diagnostic evaluation for alcohol dependence and treatment.   CLINICAL FACTORS:   Previous Psychiatric Diagnoses and Treatments      COGNITIVE FEATURES THAT CONTRIBUTE TO RISK:  None    SUICIDE RISK:   Minimal: No identifiable suicidal ideation.  Patients presenting with no risk factors but with morbid ruminations; may be classified as minimal risk based on the severity of the depressive symptoms  PLAN OF CARE: See H&P  I certify that inpatient services furnished can reasonably be expected to improve the patient's condition.   Haskell RilingHolly R Leisa Gault, MD 06/30/2017, 2:39 PM
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
## 2017-06-30 NOTE — Plan of Care (Signed)
Pleasant, childlike, due to ASD, CBG monitored throughout shift and insulin administered per sliding scale coverage. Patient is present in the milieu and is  friendly with other patients. Repeatedly asking me when he will be discharged; no temper tantrums exhibited, denied SI/HI/AVH. Milieu remains safe with q 15 minute safety checks.    
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
## 2017-06-30 NOTE — [facility] Group Notes (Signed)
  06/30/2017  Time: 1:00PM  Type of Therapy and Topic:  Group Therapy:  Feelings around Relapse and Recovery  Participation Level:  Active   Description of Group:    Patients in this group will discuss emotions they experience before and after a relapse. They will process how experiencing these feelings, or avoidance of experiencing them, relates to having a relapse. Facilitator will guide patients to explore emotions they have related to recovery. Patients will be encouraged to process which emotions are more powerful. They will be guided to discuss the emotional reaction significant others in their lives may have to their relapse or recovery. Patients will be assisted in exploring ways to respond to the emotions of others without this contributing to a relapse.  Therapeutic Goals: 1. Patient will identify two or more emotions that lead to a relapse for them 2. Patient will identify two emotions that result when they relapse 3. Patient will identify two emotions related to recovery 4. Patient will demonstrate ability to communicate their needs through discussion and/or role plays   Summary of Patient Progress: Pt continues to work towards their tx goals but has not yet reached them. Pt was able to appropriately participate in group discussion, and was able to offer support/validation to other group members. Pt reported to things that cause him to relapse with his mental health are, "not taking medications right or not doing my ADLs." Pt reported two ways he can prevent mental health relapse are, "coping skills like being positive and going to my classes." Pt reported, "I am feeling happy today because I am going home on Monday!"   Therapeutic Modalities:   Cognitive Behavioral Therapy Solution-Focused Therapy Assertiveness Training Relapse Prevention Therapy  Heidi DachKelsey Cookie Pore, MSW, LCSW 06/30/2017 2:00 PM

## 2017-06-30 NOTE — Progress Notes (Signed)
Inpatient Diabetes Program Recommendations  AACE/ADA: New Consensus Statement on Inpatient Glycemic Control (2015)  Target Ranges:  Prepandial:   less than 140 mg/dL      Peak postprandial:   less than 180 mg/dL (1-2 hours)      Critically ill patients:  140 - 180 mg/dL   Lab Results  Component Value Date   GLUCAP 165 (H) 06/30/2017   HGBA1C 8.2 (H) 06/29/2017    Review of Glycemic Control  Results for Cassie FreerGRIER, Omarius R (MRN [id number]) as of 06/30/2017 08:19  Ref. Range 06/29/2017 11:29 06/29/2017 18:55 06/29/2017 20:24 06/29/2017 21:04 06/30/2017 07:15  Glucose-Capillary Latest Ref Range: 65 - 99 mg/dL 562217 (H) 130336 (H) 865406 (H) 368 (H) 165 (H)    Diabetes history: New diagnosis Type 2, A1C 8.2% Outpatient Diabetes medications: none Current orders for Inpatient glycemic control: Metformin 500mg  bid, Novolog 0-9 units tid, carb modified diet  Inpatient Diabetes Program Recommendations:   Agree with current medications for blood sugar management.   Please have patient take Metformin with food.  Educate on the likelihood of gas, diarrhea and stomach pain for a couple of weeks on this medication- it will subside. Consider increasing Metformin to 1000mg  bid in 1 week.   Follow up with MD at discharge.  Instruct group home that this patient cannot drink any drinks with sugar- including Gatorade or Powerade, [employer] FinancialSunny Delight, Koolaid, soda, sweet tea or juice- even if it says no sugar added.    Patient can drink any drink that has 0 carbohydrates- as often as he likes.   Thank you for asking us to consult on this patient.  Susette RacerJulie Charbel Los, RN, BA, MHA, CDE Diabetes Coordinator Inpatient Diabetes Program  [phone] (Team Pager) [phone] [facility] Office) 06/30/2017 8:26 AM

## 2017-06-30 NOTE — Plan of Care (Signed)
Patient slept for Estimated Hours of 5.45; Precautionary checks every 15 minutes for safety maintained, room free of safety hazards, patient sustains no injury or falls during this shift.  

## 2017-06-30 NOTE — Tx Team (Addendum)
Interdisciplinary Treatment and Diagnostic Plan Update  06/30/2017 Time of Session: 11am Jesse Mckenzie MRN: [id number]  Principal Diagnosis: <principal problem not specified>  Secondary Diagnoses: Active Problems:   Adjustment disorder with mixed disturbance of emotions and conduct   Current Medications:  Current Facility-Administered Medications  Medication Dose Route Frequency Provider Last Rate Last Dose  . acetaminophen (TYLENOL) tablet 650 mg  650 mg Oral Q6H PRN Clapacs, John T, MD      . alum & mag hydroxide-simeth (MAALOX/MYLANTA) 200-200-20 MG/5ML suspension 30 mL  30 mL Oral Q4H PRN Clapacs, John T, MD      . benztropine (COGENTIN) tablet 1 mg  1 mg Oral BID Clapacs, Jackquline DenmarkJohn T, MD   1 mg at 06/30/17 0756  . clonazePAM (KLONOPIN) tablet 0.5 mg  0.5 mg Oral TID Clapacs, Jackquline DenmarkJohn T, MD   0.5 mg at 06/30/17 0756  . divalproex (DEPAKOTE ER) 24 hr tablet 2,000 mg  2,000 mg Oral QHS Clapacs, John T, MD   2,000 mg at 06/29/17 2145  . gabapentin (NEURONTIN) capsule 100 mg  100 mg Oral TID Clapacs, John T, MD   100 mg at 06/30/17 0756  . haloperidol (HALDOL) tablet 4 mg  4 mg Oral BID McNew, Holly R, MD      . insulin aspart (novoLOG) injection 0-9 Units  0-9 Units Subcutaneous TID WC Clapacs, Jackquline DenmarkJohn T, MD   2 Units at 06/30/17 0756  . magnesium hydroxide (MILK OF MAGNESIA) suspension 30 mL  30 mL Oral Daily PRN Clapacs, John T, MD      . metFORMIN (GLUCOPHAGE) tablet 500 mg  500 mg Oral BID WC Clapacs, Jackquline DenmarkJohn T, MD   500 mg at 06/30/17 0756  . traZODone (DESYREL) tablet 50 mg  50 mg Oral QHS PRN McNew, Ileene HutchinsonHolly R, MD       PTA Medications: Medications Prior to Admission  Medication Sig Dispense Refill Last Dose  . benztropine (COGENTIN) 1 MG tablet Take 1 tablet (1 mg total) by mouth 2 (two) times daily. 60 tablet 1   . clonazePAM (KLONOPIN) 0.5 MG tablet Take 1 tablet (0.5 mg total) by mouth 3 (three) times daily. 90 tablet 1   . divalproex (DEPAKOTE ER) 500 MG 24 hr tablet Take 4 tablets (2,000  mg total) by mouth at bedtime. 120 tablet 1   . gabapentin (NEURONTIN) 100 MG capsule Take 1 capsule (100 mg total) by mouth 3 (three) times daily. 90 capsule 1   . haloperidol (HALDOL) 2 MG tablet Take 2 tablets (4 mg total) by mouth 2 (two) times daily. 120 tablet 1   . QUEtiapine (SEROQUEL) 300 MG tablet Take 1 tablet (300 mg total) by mouth at bedtime. 30 tablet 1   . traZODone (DESYREL) 50 MG tablet Take 1 tablet (50 mg total) by mouth at bedtime as needed for sleep. 30 tablet 1     Patient Stressors: Marital or family conflict Traumatic event  Patient Strengths: Motivation for treatment/growth Supportive family/friends  Treatment Modalities: Medication Management, Group therapy, Case management,  1 to 1 session with clinician, Psychoeducation, Recreational therapy.   Physician Treatment Plan for Primary Diagnosis: <principal problem not specified> Long Term Goal(s):     Short Term Goals:    Medication Management: Evaluate patient's response, side effects, and tolerance of medication regimen.  Therapeutic Interventions: 1 to 1 sessions, Unit Group sessions and Medication administration.  Evaluation of Outcomes: Progressing  Physician Treatment Plan for Secondary Diagnosis: Active Problems:   Adjustment disorder with mixed  disturbance of emotions and conduct  Long Term Goal(s):     Short Term Goals:       Medication Management: Evaluate patient's response, side effects, and tolerance of medication regimen.  Therapeutic Interventions: 1 to 1 sessions, Unit Group sessions and Medication administration.  Evaluation of Outcomes: Progressing   RN Treatment Plan for Primary Diagnosis: <principal problem not specified> Long Term Goal(s): Knowledge of disease and therapeutic regimen to maintain health will improve  Short Term Goals: Ability to participate in decision making will improve and Compliance with prescribed medications will improve  Medication Management: RN will  administer medications as ordered by provider, will assess and evaluate patient's response and provide education to patient for prescribed medication. RN will report any adverse and/or side effects to prescribing provider.  Therapeutic Interventions: 1 on 1 counseling sessions, Psychoeducation, Medication administration, Evaluate responses to treatment, Monitor vital signs and CBGs as ordered, Perform/monitor CIWA, COWS, AIMS and Fall Risk screenings as ordered, Perform wound care treatments as ordered.  Evaluation of Outcomes: Progressing   LCSW Treatment Plan for Primary Diagnosis: <principal problem not specified> Long Term Goal(s): Safe transition to appropriate next level of care at discharge, Engage patient in therapeutic group addressing interpersonal concerns.  Short Term Goals: Engage patient in aftercare planning with referrals and resources, Increase social support and Increase skills for wellness and recovery  Therapeutic Interventions: Assess for all discharge needs, 1 to 1 time with Social worker, Explore available resources and support systems, Assess for adequacy in community support network, Educate family and significant other(s) on suicide prevention, Complete Psychosocial Assessment, Interpersonal group therapy.  Evaluation of Outcomes: Progressing   Progress in Treatment: Attending groups: Yes. Participating in groups: Yes. Taking medication as prescribed: Yes. Toleration medication: Yes. Family/Significant other contact made: No, will contact:  Guardian Amy Gillium Patient understands diagnosis: Yes. Discussing patient identified problems/goals with staff: Yes. Medical problems stabilized or resolved: Yes. Denies suicidal/homicidal ideation: Yes. Issues/concerns per patient self-inventory: No. Other:  New problem(s) identified: No, Describe:  None  New Short Term/Long Term Goal(s): "To control my impulses by using coping skills such as music, exercising and  talking to someone when I have a problem."  Discharge Plan or Barriers: To return to group home and follow up with providers.   Reason for Continuation of Hospitalization: Medication stabilization  Estimated Length of Stay: 3-5days   Recreational Therapy: Patient Stressors: Health Patient Goal:  Patient will attend and participate in Recreation Therapy Group Sessions x5 days  Attendees: Patient: Jesse Mckenzie 06/30/2017 11:24 AM  Physician: Corinna Gab, MD 06/30/2017 11:24 AM  Nursing: Leonia Reader, RN 06/30/2017 11:24 AM  RN Care Manager: 06/30/2017 11:24 AM  Social Worker: Johny Shears, LCSWA 06/30/2017 11:24 AM  Recreational Therapist: Garret Reddish CTRS, LRT 06/30/2017 11:24 AM  Other: Heidi Dach, LCSW 06/30/2017 11:24 AM  Other:  06/30/2017 11:24 AM  Other: 06/30/2017 11:24 AM    Scribe for Treatment Team: Johny Shears, LCSW 06/30/2017 11:24 AM

## 2017-06-30 NOTE — Progress Notes (Signed)
Recreation Therapy Notes  Date: 01.11.2019  Time: 9:30 am  Location: Craft Room  Behavioral response: N/A  Intervention Topic: Leisure  Discussion/Intervention: Patient did not attend group. Clinical Observations/Feedback:  Patient did not attend group. Nadim Malia LRT/CTRS         Bobak Oguinn 06/30/2017 10:27 AM

## 2017-07-01 LAB — GLUCOSE, CAPILLARY
GLUCOSE-CAPILLARY: 127 mg/dL — AB (ref 65–99)
GLUCOSE-CAPILLARY: 156 mg/dL — AB (ref 65–99)
Glucose-Capillary: 80 mg/dL (ref 65–99)
Glucose-Capillary: 248 mg/dL — ABNORMAL HIGH (ref 65–99)

## 2017-07-01 LAB — VALPROIC ACID LEVEL: VALPROIC ACID LVL: 84 ug/mL (ref 50.0–100.0)

## 2017-07-01 NOTE — [facility] Group Notes (Signed)
LCSW Group Therapy Note  07/01/2017 1:15pm  Type of Therapy and Topic:  Group Therapy:  Cognitive Distortions  Participation Level:  Active   Description of Group:    Patients in this group will be introduced to the topic of cognitive distortions.  Patients will identify and describe cognitive distortions, describe the feelings these distortions create for them.  Patients will identify one or more situations in their personal life where they have cognitively distorted thinking and will verbalize challenging this cognitive distortion through positive thinking skills.  Patients will practice the skill of using positive affirmations to challenge cognitive distortions using affirmation cards.    Therapeutic Goals:  1. Patient will identify two or more cognitive distortions they have used 2. Patient will identify one or more emotions that stem from use of a cognitive distortion 3. Patient will demonstrate use of a positive affirmation to counter a cognitive distortion through discussion and/or role play. 4. Patient will describe one way cognitive distortions can be detrimental to wellness   Summary of Patient Progress: Pt actively participated in group discussion. Pt was able to identify cognitive distortions he has used in the past. Pt shared some of his unhelpful thinking styles that are affecting him the most such as emotional reasoning. Pt used positive affirmations to counter a cognitive distortion through discussion.       Therapeutic Modalities:   Cognitive Behavioral Therapy Motivational Interviewing   Colena Ketterman  CUEBAS-COLON, LCSW 07/01/2017 11:23 AM

## 2017-07-01 NOTE — Progress Notes (Signed)
[facility]  07/01/2017 8:26 PM Jesse Mckenzie  MRN:  [id number] Subjective:    FBG 127 today  He is walking around the unit and appears euthymic.  He states that he slept well last night.  He wants to know if he can go home and states that he isn't going to do the wrong stuff anymore, he doesn't want to hurt himself or anyone else.  He plans to go to group today.  Denies SE Principal Problem: Adjustment disorder with mixed disturbance of emotions and conduct Diagnosis:   Patient Active Problem List   Diagnosis Date Noted  . Elevated blood sugar [R73.9] 06/29/2017  . Autism [F84.0] 04/25/2017  . Developmental delay [R62.50] 04/25/2017  . Adjustment disorder with mixed disturbance of emotions and conduct [F43.25] 04/25/2017   Total Time spent with patient: 20 minutes  Past Psychiatric History: see h&p  Past Medical History:  Past Medical History:  Diagnosis Date  . Autism   . Developmental delay   . Impulsive personality disorder (HCC)   . Oppositional defiant disorder    History reviewed. No pertinent surgical history. Family History: History reviewed. No pertinent family history. Family Psychiatric  History: see h&p Social History:  Social History   Substance and Sexual Activity  Alcohol Use No  . Frequency: Never     Social History   Substance and Sexual Activity  Drug Use No    Social History   Socioeconomic History  . Marital status: Single    Spouse name: None  . Number of children: None  . Years of education: None  . Highest education level: None  Social Needs  . Financial resource strain: None  . Food insecurity - worry: None  . Food insecurity - inability: None  . Transportation needs - medical: None  . Transportation needs - non-medical: None  Occupational History  . None  Tobacco Use  . Smoking status: Never Smoker  . Smokeless tobacco: Never Used  Substance and Sexual Activity  . Alcohol use: No    Frequency: Never  . Drug use: No   . Sexual activity: No  Other Topics Concern  . None  Social History Narrative  . None   Additional Social History:    Pain Medications: See PTA Prescriptions: See PTA Over the Counter: See PTA History of alcohol / drug use?: No history of alcohol / drug abuse                    Sleep: Good  Appetite:  Good  Current Medications: Current Facility-Administered Medications  Medication Dose Route Frequency Provider Last Rate Last Dose  . acetaminophen (TYLENOL) tablet 650 mg  650 mg Oral Q6H PRN Clapacs, John T, MD      . alum & mag hydroxide-simeth (MAALOX/MYLANTA) 200-200-20 MG/5ML suspension 30 mL  30 mL Oral Q4H PRN Clapacs, John T, MD      . benztropine (COGENTIN) tablet 1 mg  1 mg Oral BID Clapacs, Jackquline DenmarkJohn T, MD   1 mg at 07/01/17 1630  . clonazePAM (KLONOPIN) tablet 0.5 mg  0.5 mg Oral TID Clapacs, John T, MD   0.5 mg at 07/01/17 1630  . divalproex (DEPAKOTE ER) 24 hr tablet 2,000 mg  2,000 mg Oral QHS Clapacs, John T, MD   2,000 mg at 06/30/17 2148  . gabapentin (NEURONTIN) capsule 100 mg  100 mg Oral TID Clapacs, John T, MD   100 mg at 07/01/17 1630  . haloperidol (HALDOL) tablet 4  mg  4 mg Oral BID Haskell Riling, MD   4 mg at 07/01/17 1630  . insulin aspart (novoLOG) injection 0-9 Units  0-9 Units Subcutaneous TID WC Clapacs, Jackquline Denmark, MD   2 Units at 07/01/17 1142  . magnesium hydroxide (MILK OF MAGNESIA) suspension 30 mL  30 mL Oral Daily PRN Clapacs, John T, MD      . metFORMIN (GLUCOPHAGE) tablet 500 mg  500 mg Oral BID WC Clapacs, John T, MD   500 mg at 07/01/17 1630  . traZODone (DESYREL) tablet 50 mg  50 mg Oral QHS PRN McNew, Ileene Hutchinson, MD        Lab Results:  Results for orders placed or performed during the hospital encounter of 06/29/17 (from the past 48 hour(s))  Glucose, capillary     Status: Abnormal   Collection Time: 06/29/17  9:04 PM  Result Value Ref Range   Glucose-Capillary 368 (H) 65 - 99 mg/dL   Comment 1 Notify RN   Glucose, capillary      Status: Abnormal   Collection Time: 06/30/17  7:15 AM  Result Value Ref Range   Glucose-Capillary 165 (H) 65 - 99 mg/dL   Comment 1 Notify RN   Glucose, capillary     Status: Abnormal   Collection Time: 06/30/17 11:43 AM  Result Value Ref Range   Glucose-Capillary 164 (H) 65 - 99 mg/dL  Glucose, capillary     Status: Abnormal   Collection Time: 06/30/17  4:29 PM  Result Value Ref Range   Glucose-Capillary 125 (H) 65 - 99 mg/dL   Comment 1 Notify RN   Glucose, capillary     Status: Abnormal   Collection Time: 06/30/17  9:45 PM  Result Value Ref Range   Glucose-Capillary 150 (H) 65 - 99 mg/dL  Glucose, capillary     Status: Abnormal   Collection Time: 07/01/17  7:19 AM  Result Value Ref Range   Glucose-Capillary 127 (H) 65 - 99 mg/dL  Valproic acid level     Status: None   Collection Time: 07/01/17  9:21 AM  Result Value Ref Range   Valproic Acid Lvl 84 50.0 - 100.0 ug/mL    Comment: Performed at [facility], [address] [place], [place] [address]  Glucose, capillary     Status: Abnormal   Collection Time: 07/01/17 11:30 AM  Result Value Ref Range   Glucose-Capillary 156 (H) 65 - 99 mg/dL  Glucose, capillary     Status: None   Collection Time: 07/01/17  4:28 PM  Result Value Ref Range   Glucose-Capillary 80 65 - 99 mg/dL   Comment 1 Notify RN     Blood Alcohol level:  Lab Results  Component Value Date   ETH <10 06/28/2017   ETH <10 06/19/2017    Metabolic Disorder Labs: Lab Results  Component Value Date   HGBA1C 8.2 (H) 06/29/2017   MPG 188.64 06/29/2017   MPG 188.64 06/28/2017   No results found for: PROLACTIN No results found for: CHOL, TRIG, HDL, CHOLHDL, VLDL, LDLCALC  Physical Findings: AIMS: Facial and Oral Movements Muscles of Facial Expression: None, normal Lips and Perioral Area: None, normal Jaw: None, normal Tongue: None, normal,Extremity Movements Upper (arms, wrists, hands, fingers): None, normal Lower (legs, knees, ankles,  toes): None, normal, Trunk Movements Neck, shoulders, hips: None, normal, Overall Severity Severity of abnormal movements (highest score from questions above): None, normal Incapacitation due to abnormal movements: None, normal Patient's awareness of abnormal movements (rate  only patient's report): No Awareness, Dental Status Current problems with teeth and/or dentures?: No Does patient usually wear dentures?: No  CIWA:    COWS:     Musculoskeletal: Strength & Muscle Tone: within normal limits Gait & Station: normal Patient leans: N/A  Psychiatric Specialty Exam: Physical Exam  ROS  Blood pressure 126/80, pulse 87, temperature 98.2 F (36.8 C), temperature source Oral, resp. rate 16, height 5\' 6"  (1.676 m), weight 90.7 kg (200 lb), SpO2 98 %.Body mass index is 32.28 kg/m.  General Appearance: Casual  Eye Contact:  Poor  Speech:  Clear and Coherent  Volume:  Normal  Mood:  Euthymic  Affect:  Appropriate  Thought Process:  Coherent  Orientation:  Full (Time, Place, and Person)  Thought Content:  Logical  Suicidal Thoughts:  No  Homicidal Thoughts:  No  Memory:  Immediate;   Fair Recent;   Fair Remote;   Fair  Judgement:  Poor  Insight:  Lacking  Psychomotor Activity:  Normal  Concentration:  Concentration: Fair and Attention Span: Fair  Recall:  [employer] of Knowledge:  Fair  Language:  Fair  Akathisia:  No  Handed:  Right  AIMS (if indicated):     Assets:  Communication Skills Housing Social Support  ADL's:  Intact  Cognition:  WNL  Sleep:  Number of Hours: 6    Treatment Plan Summary: 22 yo male with history of autistic spectrum disorder admitted after he had threatening behaviors at group home. HE was found to have elevated blood glucose and no past diagnosis of diabetes. His Seroquel was discontinued in ED due to risk of metabolic symptoms. He is also on Haldol at group home. Diabetes care coordinator on board to help manage blood sugars.  Plan:  Autistic  spectrum disorder/ Adjustment disorder -Seroquel d/cd-he does not have history of psychosis -Continue home medications of Haldol for behavioral managements, Depakote, Klonopin  Elevated Blood Glucose -A1C 8.2 -Metformin 500 mg BID, Will increase to 1000 mg in 1 week -Sliding scale insulin TID  Dispo -I have left message for guardian about medication changes however she is out of the office until Monday. He will return to group home on discharge  Observation Level/Precautions:  15 minute checks  Laboratory:  Depakote level  Psychotherapy:    Medications:    Consultations:    Discharge Concerns:    Estimated LOS:3-5 days  Other:     Physician Treatment Plan for Primary Diagnosis: Adjustment disorder with mixed disturbance of emotions and conduct Long Term Goal(s): Improvement in symptoms so as ready for discharge  Short Term Goals: Ability to disclose and discuss suicidal ideas    I certify that inpatient services furnished can reasonably be expected to improve the patient's condition.         Cindee Lame, MD 07/01/2017, 8:26 PM

## 2017-07-01 NOTE — Plan of Care (Signed)
Pleasant and cooperative. Alert and oriented and currently denies suicidal/homicidal thoughts. Thought process organized. Patient attended group and was able to express his opinions and concerns. Patient reported that he is feeling improved mentally, that he is now able to control his emotions. Milieu remains safe with q 15 minute checks

## 2017-07-01 NOTE — Progress Notes (Signed)
Pleasant and cooperative. Alert and oriented and currently denies suicidal/homicidal thoughts. Thought process organized. Patient attended group and was able to express his opinions and concerns. Patient reported that he is feeling improved mentally, that he is now able to control his emotions. Milieu remains safe with q 15 minute checks  

## 2017-07-01 NOTE — Plan of Care (Signed)
Attended group and able to express his thoughts, feelings and concerns.

## 2017-07-01 NOTE — Progress Notes (Signed)
Patient stayed in the milieu until bed time. Pleasant and cooperative. Alert and oriented and denying suicidal/homicidal thoughts. Thought process organized. Denying hallucinations. Patient attended group and was able to express his opinions and concerns.  Presented to the medication room, had his bedtime medications and discussed about the behavior that let to his admission. Patient reported that he is feeling improved emotionally, that he is now able to control his emotions. Did not have any major problem. Currently in bed sleeping. Safety and security maintained.

## 2017-07-02 LAB — GLUCOSE, CAPILLARY
GLUCOSE-CAPILLARY: 146 mg/dL — AB (ref 65–99)
Glucose-Capillary: 126 mg/dL — ABNORMAL HIGH (ref 65–99)
Glucose-Capillary: 147 mg/dL — ABNORMAL HIGH (ref 65–99)
Glucose-Capillary: 125 mg/dL — ABNORMAL HIGH (ref 65–99)

## 2017-07-02 NOTE — Progress Notes (Signed)
[facility] MD Progress Note  07/02/2017 8:33 PM Jesse Mckenzie  MRN:  [id number] Subjective:    FBG 126 today  Jesse Mckenzie says he is doing "good" today.  He mainly smiles and nods without providing many details.  Denies si/hi.  Hopes he can go home next week.   Principal Problem: Adjustment disorder with mixed disturbance of emotions and conduct Diagnosis:   Patient Active Problem List   Diagnosis Date Noted  . Elevated blood sugar [R73.9] 06/29/2017  . Autism [F84.0] 04/25/2017  . Developmental delay [R62.50] 04/25/2017  . Adjustment disorder with mixed disturbance of emotions and conduct [F43.25] 04/25/2017   Total Time spent with patient: 20 minutes  Past Psychiatric History: see h&p  Past Medical History:  Past Medical History:  Diagnosis Date  . Autism   . Developmental delay   . Impulsive personality disorder (HCC)   . Oppositional defiant disorder    History reviewed. No pertinent surgical history. Family History: History reviewed. No pertinent family history. Family Psychiatric  History: see h&p Social History:  Social History   Substance and Sexual Activity  Alcohol Use No  . Frequency: Never     Social History   Substance and Sexual Activity  Drug Use No    Social History   Socioeconomic History  . Marital status: Single    Spouse name: None  . Number of children: None  . Years of education: None  . Highest education level: None  Social Needs  . Financial resource strain: None  . Food insecurity - worry: None  . Food insecurity - inability: None  . Transportation needs - medical: None  . Transportation needs - non-medical: None  Occupational History  . None  Tobacco Use  . Smoking status: Never Smoker  . Smokeless tobacco: Never Used  Substance and Sexual Activity  . Alcohol use: No    Frequency: Never  . Drug use: No  . Sexual activity: No  Other Topics Concern  . None  Social History Narrative  . None   Additional Social History:    Pain  Medications: See PTA Prescriptions: See PTA Over the Counter: See PTA History of alcohol / drug use?: No history of alcohol / drug abuse                    Sleep: Good  Appetite:  Good  Current Medications: Current Facility-Administered Medications  Medication Dose Route Frequency Provider Last Rate Last Dose  . acetaminophen (TYLENOL) tablet 650 mg  650 mg Oral Q6H PRN Clapacs, John T, MD      . alum & mag hydroxide-simeth (MAALOX/MYLANTA) 200-200-20 MG/5ML suspension 30 mL  30 mL Oral Q4H PRN Clapacs, John T, MD      . benztropine (COGENTIN) tablet 1 mg  1 mg Oral BID Clapacs, Jackquline Denmark, MD   1 mg at 07/02/17 1627  . clonazePAM (KLONOPIN) tablet 0.5 mg  0.5 mg Oral TID Clapacs, Jackquline Denmark, MD   0.5 mg at 07/02/17 1627  . divalproex (DEPAKOTE ER) 24 hr tablet 2,000 mg  2,000 mg Oral QHS Clapacs, John T, MD   2,000 mg at 07/01/17 2109  . gabapentin (NEURONTIN) capsule 100 mg  100 mg Oral TID Clapacs, John T, MD   100 mg at 07/02/17 1627  . haloperidol (HALDOL) tablet 4 mg  4 mg Oral BID Haskell Riling, MD   4 mg at 07/02/17 1627  . insulin aspart (novoLOG) injection 0-9 Units  0-9 Units Subcutaneous TID  WC Clapacs, Jackquline DenmarkJohn T, MD   1 Units at 07/02/17 1630  . magnesium hydroxide (MILK OF MAGNESIA) suspension 30 mL  30 mL Oral Daily PRN Clapacs, John T, MD      . metFORMIN (GLUCOPHAGE) tablet 500 mg  500 mg Oral BID WC Clapacs, Jackquline DenmarkJohn T, MD   500 mg at 07/02/17 1627  . traZODone (DESYREL) tablet 50 mg  50 mg Oral QHS PRN McNew, Ileene HutchinsonHolly R, MD   50 mg at 07/01/17 2327    Lab Results:  Results for orders placed or performed during the hospital encounter of 06/29/17 (from the past 48 hour(s))  Glucose, capillary     Status: Abnormal   Collection Time: 06/30/17  9:45 PM  Result Value Ref Range   Glucose-Capillary 150 (H) 65 - 99 mg/dL  Glucose, capillary     Status: Abnormal   Collection Time: 07/01/17  7:19 AM  Result Value Ref Range   Glucose-Capillary 127 (H) 65 - 99 mg/dL  Valproic acid  level     Status: None   Collection Time: 07/01/17  9:21 AM  Result Value Ref Range   Valproic Acid Lvl 84 50.0 - 100.0 ug/mL    Comment: Performed at [facility], [address] [place], [place] [address]  Glucose, capillary     Status: Abnormal   Collection Time: 07/01/17 11:30 AM  Result Value Ref Range   Glucose-Capillary 156 (H) 65 - 99 mg/dL  Glucose, capillary     Status: None   Collection Time: 07/01/17  4:28 PM  Result Value Ref Range   Glucose-Capillary 80 65 - 99 mg/dL   Comment 1 Notify RN   Glucose, capillary     Status: Abnormal   Collection Time: 07/01/17  8:51 PM  Result Value Ref Range   Glucose-Capillary 248 (H) 65 - 99 mg/dL   Comment 1 Notify RN   Glucose, capillary     Status: Abnormal   Collection Time: 07/02/17  7:07 AM  Result Value Ref Range   Glucose-Capillary 126 (H) 65 - 99 mg/dL   Comment 1 Notify RN   Glucose, capillary     Status: Abnormal   Collection Time: 07/02/17 11:25 AM  Result Value Ref Range   Glucose-Capillary 146 (H) 65 - 99 mg/dL   Comment 1 Notify RN   Glucose, capillary     Status: Abnormal   Collection Time: 07/02/17  4:26 PM  Result Value Ref Range   Glucose-Capillary 147 (H) 65 - 99 mg/dL    Blood Alcohol level:  Lab Results  Component Value Date   ETH <10 06/28/2017   ETH <10 06/19/2017    Metabolic Disorder Labs: Lab Results  Component Value Date   HGBA1C 8.2 (H) 06/29/2017   MPG 188.64 06/29/2017   MPG 188.64 06/28/2017   No results found for: PROLACTIN No results found for: CHOL, TRIG, HDL, CHOLHDL, VLDL, LDLCALC  Physical Findings: AIMS: Facial and Oral Movements Muscles of Facial Expression: None, normal Lips and Perioral Area: None, normal Jaw: None, normal Tongue: None, normal,Extremity Movements Upper (arms, wrists, hands, fingers): None, normal Lower (legs, knees, ankles, toes): None, normal, Trunk Movements Neck, shoulders, hips: None, normal, Overall Severity Severity of abnormal  movements (highest score from questions above): None, normal Incapacitation due to abnormal movements: None, normal Patient's awareness of abnormal movements (rate only patient's report): No Awareness, Dental Status Current problems with teeth and/or dentures?: No Does patient usually wear dentures?: No  CIWA:    COWS:  Musculoskeletal: Strength & Muscle Tone: within normal limits Gait & Station: normal Patient leans: N/A  Psychiatric Specialty Exam: Physical Exam   ROS   Blood pressure 116/79, pulse (!) 111, temperature 98.1 F (36.7 C), temperature source Oral, resp. rate 16, height 5\' 6"  (1.676 m), weight 90.7 kg (200 lb), SpO2 98 %.Body mass index is 32.28 kg/m.  General Appearance: Casual  Eye Contact:  Poor  Speech:  Clear and Coherent  Volume:  Normal  Mood:  Euthymic  Affect:  Appropriate  Thought Process:  Coherent  Orientation:  Full (Time, Place, and Person)  Thought Content:  Logical  Suicidal Thoughts:  No  Homicidal Thoughts:  No  Memory:  Immediate;   Fair Recent;   Fair Remote;   Fair  Judgement:  Poor  Insight:  Lacking  Psychomotor Activity:  Normal  Concentration:  Concentration: Fair and Attention Span: Fair  Recall:  [employer] of Knowledge:  Fair  Language:  Fair  Akathisia:  No  Handed:  Right  AIMS (if indicated):     Assets:  Communication Skills Housing Social Support  ADL's:  Intact  Cognition:  WNL  Sleep:  Number of Hours: 6.5    Treatment Plan Summary: 22 yo male with history of autistic spectrum disorder admitted after he had threatening behaviors at group home. HE was found to have elevated blood glucose and no past diagnosis of diabetes. His Seroquel was discontinued in ED due to risk of metabolic symptoms. He is also on Haldol at group home. Diabetes care coordinator on board to help manage blood sugars.  Plan:  Autistic spectrum disorder/ Adjustment disorder -Seroquel d/cd-he does not have history of  psychosis -Continue home medications of Haldol for behavioral managements, Depakote, Klonopin  Elevated Blood Glucose -A1C 8.2 -Metformin 500 mg BID, Will increase to 1000 mg in 1 week -Sliding scale insulin TID  Dispo -I have left message for guardian about medication changes however she is out of the office until Monday. He will return to group home on discharge  Observation Level/Precautions:  15 minute checks  Laboratory:  Depakote level  Psychotherapy:    Medications:    Consultations:    Discharge Concerns:    Estimated LOS:3-5 days  Other:     Physician Treatment Plan for Primary Diagnosis: Adjustment disorder with mixed disturbance of emotions and conduct Long Term Goal(s): Improvement in symptoms so as ready for discharge  Short Term Goals: Ability to disclose and discuss suicidal ideas    I certify that inpatient services furnished can reasonably be expected to improve the patient's condition.         Cindee Lame, MD 07/02/2017, 8:33 PM

## 2017-07-02 NOTE — [facility] Group Notes (Signed)
LCSW Group Therapy Note 07/02/2017 1:15pm  Type of Therapy and Topic: Group Therapy: Feelings Around Returning Home & Establishing a Supportive Framework and Supporting Oneself When Supports Not Available  Participation Level: Active  Description of Group:  Patients first processed thoughts and feelings about upcoming discharge. These included fears of upcoming changes, lack of change, new living environments, judgements and expectations from others and overall stigma of mental health issues. The group then discussed the definition of a supportive framework, what that looks and feels like, and how do to discern it from an unhealthy non-supportive network. The group identified different types of supports as well as what to do when your family/friends are less than helpful or unavailable  Therapeutic Goals  1. Patient will identify one healthy supportive network that they can use at discharge. 2. Patient will identify one factor of a supportive framework and how to tell it from an unhealthy network. 3. Patient able to identify one coping skill to use when they do not have positive supports from others. 4. Patient will demonstrate ability to communicate their needs through discussion and/or role plays.  Summary of Patient Progress:  Pt engaged during group session. As patients processed their anxiety about discharge and described healthy supports patient shared he is not having suicidal thoughts and feels good about going back to his group home. He reported that he is going to use all his coping skills to stay healthy. Patients identified at least one self-care tool they were willing to use after discharge.   Therapeutic Modalities Cognitive Behavioral Therapy Motivational Interviewing   Kaydin Karbowski  CUEBAS-COLON, LCSW 07/02/2017 12:28 PM

## 2017-07-02 NOTE — [facility] Group Notes (Signed)
[facility] Group Notes:  (Nursing/MHT/Case Management/Adjunct)  Date:  07/02/2017  Time:  5:49 AM  Type of Therapy:  Psychoeducational Skills  Participation Level:  Active  Participation Quality:  Appropriate, Attentive and Sharing  Affect:  Appropriate  Cognitive:  Appropriate  Insight:  Appropriate and Good  Engagement in Group:  Engaged  Modes of Intervention:  Discussion, Socialization and Support  Summary of Progress/Problems:  Jesse MilroyLaquanda Y Rhyan Mckenzie 07/02/2017, 5:49 AM

## 2017-07-02 NOTE — Progress Notes (Signed)
Pleasant and cooperative on approach but he is somewhat intrusive. He currently deniessuicidal/homicidal thoughts. Thought process logical but seems to be over friendly, Clinical research associatewriter provided patient with education on a diabetic diet.  Patient reported that he is feeling improvedmentally, that he is now able to control his emotions and wants to be discharged soon.Milieu remains safe with q 15 minute checks.

## 2017-07-02 NOTE — Progress Notes (Signed)
Received Jesse Mckenzie this am during  his breakfast, he was compliant with his medication and insulin. He denied all of the psychiatric symptoms. He has been OOB and visible in the milieu. He is socializing with select peers. His status remains stable this PM.

## 2017-07-03 LAB — GLUCOSE, CAPILLARY
GLUCOSE-CAPILLARY: 146 mg/dL — AB (ref 65–99)
Glucose-Capillary: 112 mg/dL — ABNORMAL HIGH (ref 65–99)

## 2017-07-03 MED ORDER — METFORMIN HCL 500 MG PO TABS
1000.0000 mg | ORAL_TABLET | Freq: Two times a day (BID) | ORAL | Status: DC
  Administered 2017-07-03 – 2017-07-04 (×2): 1000 mg via ORAL
  Filled 2017-07-03 (×2): qty 2

## 2017-07-03 MED ORDER — METFORMIN HCL 1000 MG PO TABS: 1000.0000 mg | ORAL_TABLET | Freq: Two times a day (BID) | ORAL | 0 refills | Status: DC

## 2017-07-03 MED ORDER — HALOPERIDOL 5 MG PO TABS
5.0000 mg | ORAL_TABLET | Freq: Two times a day (BID) | ORAL | Status: DC
  Administered 2017-07-03 – 2017-07-04 (×2): 5 mg via ORAL
  Filled 2017-07-03 (×2): qty 1

## 2017-07-03 MED ORDER — HALOPERIDOL 5 MG PO TABS: 5.0000 mg | ORAL_TABLET | Freq: Two times a day (BID) | ORAL | 0 refills | Status: DC

## 2017-07-03 MED ORDER — METFORMIN HCL 500 MG PO TABS: 500.0000 mg | ORAL_TABLET | ORAL | 0 refills | Status: DC

## 2017-07-03 MED ORDER — INSULIN ASPART 100 UNIT/ML ~~LOC~~ SOLN: 0.0000 [IU] | Freq: Three times a day (TID) | SUBCUTANEOUS | 0 refills | Status: DC

## 2017-07-03 NOTE — [facility] Group Notes (Signed)
07/03/2017 1PM  Type of Therapy and Topic:  Group Therapy:  Overcoming Obstacles  Participation Level:  Did Not Attend    Description of Group:    In this group patients will be encouraged to explore what they see as obstacles to their own wellness and recovery. They will be guided to discuss their thoughts, feelings, and behaviors related to these obstacles. The group will process together ways to cope with barriers, with attention given to specific choices patients can make. Each patient will be challenged to identify changes they are motivated to make in order to overcome their obstacles. This group will be process-oriented, with patients participating in exploration of their own experiences as well as giving and receiving support and challenge from other group members.   Therapeutic Goals: 1. Patient will identify personal and current obstacles as they relate to admission. 2. Patient will identify barriers that currently interfere with their wellness or overcoming obstacles.  3. Patient will identify feelings, thought process and behaviors related to these barriers. 4. Patient will identify two changes they are willing to make to overcome these obstacles:      Summary of Patient Progress Patient was encouraged and invited to attend group. Patient did not attend group. Social worker will continue to encourage group participation in the future.     Therapeutic Modalities:   Cognitive Behavioral Therapy Solution Focused Therapy Motivational Interviewing Relapse Prevention Therapy    Johny ShearsCassandra Kmari Brian MSW, LCSWA 07/03/2017 2:00 PM

## 2017-07-03 NOTE — Plan of Care (Signed)
  Progressing Education: Ability to state activities that reduce stress will improve 07/03/2017 2017 - Progressing by Lelan PonsAriwodo, Brownie Nehme, RN Coping: Ability to identify and develop effective coping behavior will improve 07/03/2017 2017 - Progressing by Lelan PonsAriwodo, Maanya Hippert, RN Self-Concept: Level of anxiety will decrease 07/03/2017 2017 - Progressing by Lelan PonsAriwodo, Braylin Formby, RN Activity: Sleeping patterns will improve 07/03/2017 2017 - Progressing by Lelan PonsAriwodo, Jael Kostick, RN

## 2017-07-03 NOTE — [facility] Counselor (Signed)
CSW spoke with French AnaAmy Gilliam the patients Guardian. She reports concerns regarding the patients medications in reference to self-injurious behaviors. She also reports that the patient is able to return back to his group home at discharge and that [facility].    CSW sent guardian consents to be signed and returned.    Johny Shearsassandra Naaman Curro, MSW, [place], LCASA 07/03/2017 11:44 AM

## 2017-07-03 NOTE — Progress Notes (Signed)
Patient is pleasant and cooperative in the unit.Appropriate with staff & peers.Denies SI,HI and AVH.Attended groups.Compliant with medications.Appetite and energy level good.Support and encouragement given.Safety maintained in the unit with q1 checks.

## 2017-07-03 NOTE — [facility] Suicide Risk Assessment (Signed)
[facility] INPATIENT:  Family/Significant Other Suicide Prevention Education  Suicide Prevention Education:  Education Completed; Rosiland OzGuardian, Amy Gilliam, [phone] has been identified by the patient as the family member/significant other with whom the patient will be residing, and identified as the person(s) who will aid the patient in the event of a mental health crisis (suicidal ideations/suicide attempt).  With written consent from the patient, the family member/significant other has been provided the following suicide prevention education, prior to the and/or following the discharge of the patient.  The suicide prevention education provided includes the following:  Suicide risk factors  Suicide prevention and interventions  National Suicide Hotline telephone number  [facility] assessment telephone number  [place] Emergency Assistance 911  [facility] and/or [facility] telephone number  Request made of family/significant other to:  Remove weapons (e.g., guns, rifles, knives), all items previously/currently identified as safety concern.    Remove drugs/medications (over-the-counter, prescriptions, illicit drugs), all items previously/currently identified as a safety concern.  The family member/significant other verbalizes understanding of the suicide prevention education information provided.  The family member/significant other agrees to remove the items of safety concern listed above.   Guardian reports concerns of medications that need to be changed due to self-injurious behaviors. She reports that he is able to come back to the group home and [facility].   Johny ShearsCassandra  Cela Newcom 07/03/2017, 11:45 AM

## 2017-07-03 NOTE — [facility] Group Notes (Signed)
[facility] Group Notes:  (Nursing/MHT/Case Management/Adjunct)  Date:  07/03/2017  Time:  4:26 AM  Type of Therapy:  Group Therapy  Participation Level:  Active  Participation Quality:  Appropriate  Affect:  Appropriate  Cognitive:  Appropriate  Insight:  Appropriate  Engagement in Group:  Engaged  Modes of Intervention:  Discussion  Summary of Progress/Problems:  Sherrell D Lea 07/03/2017, 4:26 AM

## 2017-07-03 NOTE — Progress Notes (Signed)
Patient is calm and cooperative, socializing well in the unit, attend groups and assertive , takes his medicines and no complain of any side effects, denies any suicidal intentions or ideation at this time 15 minute safety check is in progress, no acute distress noted.

## 2017-07-03 NOTE — Progress Notes (Signed)
Patient was visible in the milieu until bedtime. Alert and oriented. Attended group, had a snack and presented to the medication room. Patient took his medications voluntarily. Denied SI/HI. Denied hallucinations and expressed readiness for discharge. Was encouraged to discuss discharge information with MD in AM. Safety precautions maintained.

## 2017-07-03 NOTE — Discharge Instructions (Addendum)
°  Ethelene Brownsnthony cannot drink any drinks with sugar- including Gatorade or Powerade, Principal FinancialSunny Delight, Koolaid, soda, sweet tea or juice- even if it says no sugar added.    Patient can drink any drink that has 0 carbohydrates- as often as he likes.   Please follow up with PCP to monitor blood sugars

## 2017-07-03 NOTE — Plan of Care (Signed)
Patient stayed in the milieu, active in groups and taking his medications.

## 2017-07-03 NOTE — Progress Notes (Signed)
Recreation Therapy Notes  Date: 01.14.2019  Time: 9:30 am  Location: Craft Room  Behavioral response: Appropriate  Intervention Topic: Self-Esteem  Discussion/Intervention: Group content today was focused on self-esteem. Patient defined self-esteem and where it comes form. The group described reasons self-esteem is important. Individuals stated things that impact self-esteem and positive ways to improve self-esteem. The group participated in the intervention "Collage of Me" where patients were able to create a collage of positive things that makes them who they are. Clinical Observations/Feedback:  Patient came to group and stated self-esteem is dealing with you. He explained that social skills can help improve self-esteem. Individual participated in the intervention and continues to work towards his goals. Patient was social with peers and staff during group.  Eris Hannan LRT/CTRS         Medford Staheli 07/03/2017 11:40 AM

## 2017-07-03 NOTE — [facility] Counselor (Signed)
CSW called patients guardian Jesse Mckenzie to request information and inquire about getting forms signed. CSW left a voicemail and will attempt to call her back later today.   [name], MSW, [place], LCASA 07/03/2017 9:29 AM

## 2017-07-03 NOTE — Plan of Care (Signed)
Patient progressing in all areas. 

## 2017-07-03 NOTE — Tx Team (Signed)
Interdisciplinary Treatment and Diagnostic Plan Update  07/03/2017 Time of Session: 11am Jesse Mckenzie MRN: [id number]  Principal Diagnosis: Adjustment disorder with mixed disturbance of emotions and conduct  Secondary Diagnoses: Principal Problem:   Adjustment disorder with mixed disturbance of emotions and conduct Active Problems:   Autism   Current Medications:  Current Facility-Administered Medications  Medication Dose Route Frequency Provider Last Rate Last Dose  . acetaminophen (TYLENOL) tablet 650 mg  650 mg Oral Q6H PRN Clapacs, John T, MD      . alum & mag hydroxide-simeth (MAALOX/MYLANTA) 200-200-20 MG/5ML suspension 30 mL  30 mL Oral Q4H PRN Clapacs, John T, MD      . benztropine (COGENTIN) tablet 1 mg  1 mg Oral BID Clapacs, Jackquline DenmarkJohn T, MD   1 mg at 07/03/17 0903  . clonazePAM (KLONOPIN) tablet 0.5 mg  0.5 mg Oral TID Clapacs, John T, MD   0.5 mg at 07/03/17 1142  . divalproex (DEPAKOTE ER) 24 hr tablet 2,000 mg  2,000 mg Oral QHS Clapacs, John T, MD   2,000 mg at 07/02/17 2106  . gabapentin (NEURONTIN) capsule 100 mg  100 mg Oral TID Clapacs, John T, MD   100 mg at 07/03/17 1142  . haloperidol (HALDOL) tablet 4 mg  4 mg Oral BID Haskell RilingMcNew, Holly R, MD   4 mg at 07/03/17 0903  . insulin aspart (novoLOG) injection 0-9 Units  0-9 Units Subcutaneous TID WC Clapacs, Jackquline DenmarkJohn T, MD   1 Units at 07/03/17 1142  . magnesium hydroxide (MILK OF MAGNESIA) suspension 30 mL  30 mL Oral Daily PRN Clapacs, John T, MD      . metFORMIN (GLUCOPHAGE) tablet 500 mg  500 mg Oral BID WC Clapacs, Jackquline DenmarkJohn T, MD   500 mg at 07/03/17 0903  . traZODone (DESYREL) tablet 50 mg  50 mg Oral QHS PRN Haskell RilingMcNew, Holly R, MD   50 mg at 07/02/17 2106   PTA Medications: Medications Prior to Admission  Medication Sig Dispense Refill Last Dose  . benztropine (COGENTIN) 1 MG tablet Take 1 tablet (1 mg total) by mouth 2 (two) times daily. 60 tablet 1   . clonazePAM (KLONOPIN) 0.5 MG tablet Take 1 tablet (0.5 mg total) by mouth 3  (three) times daily. 90 tablet 1   . divalproex (DEPAKOTE ER) 500 MG 24 hr tablet Take 4 tablets (2,000 mg total) by mouth at bedtime. 120 tablet 1   . gabapentin (NEURONTIN) 100 MG capsule Take 1 capsule (100 mg total) by mouth 3 (three) times daily. 90 capsule 1   . haloperidol (HALDOL) 2 MG tablet Take 2 tablets (4 mg total) by mouth 2 (two) times daily. 120 tablet 1   . QUEtiapine (SEROQUEL) 300 MG tablet Take 1 tablet (300 mg total) by mouth at bedtime. 30 tablet 1   . traZODone (DESYREL) 50 MG tablet Take 1 tablet (50 mg total) by mouth at bedtime as needed for sleep. 30 tablet 1     Patient Stressors: Marital or family conflict Traumatic event  Patient Strengths: Motivation for treatment/growth Supportive family/friends  Treatment Modalities: Medication Management, Group therapy, Case management,  1 to 1 session with clinician, Psychoeducation, Recreational therapy.   Physician Treatment Plan for Primary Diagnosis: Adjustment disorder with mixed disturbance of emotions and conduct Long Term Goal(s): Improvement in symptoms so as ready for discharge   Short Term Goals: Ability to disclose and discuss suicidal ideas  Medication Management: Evaluate patient's response, side effects, and tolerance of medication regimen.  Therapeutic Interventions: 1 to 1 sessions, Unit Group sessions and Medication administration.  Evaluation of Outcomes: Progressing  Physician Treatment Plan for Secondary Diagnosis: Principal Problem:   Adjustment disorder with mixed disturbance of emotions and conduct Active Problems:   Autism  Long Term Goal(s): Improvement in symptoms so as ready for discharge   Short Term Goals: Ability to disclose and discuss suicidal ideas     Medication Management: Evaluate patient's response, side effects, and tolerance of medication regimen.  Therapeutic Interventions: 1 to 1 sessions, Unit Group sessions and Medication administration.  Evaluation of Outcomes:  Progressing   RN Treatment Plan for Primary Diagnosis: Adjustment disorder with mixed disturbance of emotions and conduct Long Term Goal(s): Knowledge of disease and therapeutic regimen to maintain health will improve  Short Term Goals: Ability to participate in decision making will improve and Compliance with prescribed medications will improve  Medication Management: RN will administer medications as ordered by provider, will assess and evaluate patient's response and provide education to patient for prescribed medication. RN will report any adverse and/or side effects to prescribing provider.  Therapeutic Interventions: 1 on 1 counseling sessions, Psychoeducation, Medication administration, Evaluate responses to treatment, Monitor vital signs and CBGs as ordered, Perform/monitor CIWA, COWS, AIMS and Fall Risk screenings as ordered, Perform wound care treatments as ordered.  Evaluation of Outcomes: Progressing   LCSW Treatment Plan for Primary Diagnosis: Adjustment disorder with mixed disturbance of emotions and conduct Long Term Goal(s): Safe transition to appropriate next level of care at discharge, Engage patient in therapeutic group addressing interpersonal concerns.  Short Term Goals: Engage patient in aftercare planning with referrals and resources, Increase social support and Increase skills for wellness and recovery  Therapeutic Interventions: Assess for all discharge needs, 1 to 1 time with Social worker, Explore available resources and support systems, Assess for adequacy in community support network, Educate family and significant other(s) on suicide prevention, Complete Psychosocial Assessment, Interpersonal group therapy.  Evaluation of Outcomes: Progressing   Progress in Treatment: Attending groups: Yes. Participating in groups: Yes. Taking medication as prescribed: Yes. Toleration medication: Yes. Family/Significant other contact made: Yes, individual(s) contacted:  French Ana, Guardian Patient understands diagnosis: Yes. Discussing patient identified problems/goals with staff: Yes. Medical problems stabilized or resolved: Yes. Denies suicidal/homicidal ideation: Yes. Issues/concerns per patient self-inventory: No. Other:  New problem(s) identified: No, Describe:  None  New Short Term/Long Term Goal(s): "To control my impulses by using coping skills such as music, exercising and talking to someone when I have a problem."  Discharge Plan or Barriers: To return to group home and follow up with providers.   Reason for Continuation of Hospitalization: Medication stabilization  Estimated Length of Stay: 1 day  Recreational Therapy: Patient Stressors: Health Patient Goal:  Patient will attend and participate in Recreation Therapy Group Sessions x5 days  Attendees: Patient: Jesse Mckenzie 07/03/2017 11:53 AM  Physician: Corinna Gab, MD 07/03/2017 11:53 AM  Nursing: Cecille Amsterdam RN 07/03/2017 11:53 AM  RN Care Manager: 07/03/2017 11:53 AM  Social Worker: Johny Shears, LCSWA 07/03/2017 11:53 AM  Recreational Therapist: Garret Reddish CTRS, LRT 07/03/2017 11:53 AM  Other: Heidi Dach, LCSW 07/03/2017 11:53 AM  Other:  07/03/2017 11:53 AM  Other: 07/03/2017 11:53 AM    Scribe for Treatment Team: Johny Shears, LCSW 07/03/2017 11:53 AM

## 2017-07-03 NOTE — Progress Notes (Signed)
[facility] MD Progress Note  07/03/2017 3:17 PM Jesse Mckenzie  MRN:  [id number] Subjective:  Pt sates that he is doing well and had an "excellent weekend." HE states that his mood is good. He has not had any outburst on the unit. He denies SI or HI. He denies feeling angry of irritable. Denies AH, VH. He has been calm on the unit. He is sleeping and eating well.   I talked with his guardian, Amy. Discussed with her about elevated glucose and that Seroquel was discontinued as this has the potential for metabolic symptoms. She was wondering about medications to help with his outbursts like Abilify. Discussed that atypical anti-psychotic all have potential to cause metabolic side effects. He is currently on haldol and discussed that we could increase this slightly. She states that prn is not a good choice because he typically will not take them. Discussed that he is now on sliding scale insulin and she is concerned about this because the group home is not trained to administer this.  I did discuss with our diabetes care coordinator, Hilda Lias. His glucose has been relatively well controled on Metformin and has required very little sliding scale doses. She recommended increasing dose of Metformin to 1000 mg BID and follow up with his PCP. I relayed this information to his guardian who is in agreement with this plan.   Principal Problem: Adjustment disorder with mixed disturbance of emotions and conduct Diagnosis:   Patient Active Problem List   Diagnosis Date Noted  . Autism [F84.0] 04/25/2017    Priority: High  . Adjustment disorder with mixed disturbance of emotions and conduct [F43.25] 04/25/2017    Priority: High  . Elevated blood sugar [R73.9] 06/29/2017  . Developmental delay [R62.50] 04/25/2017   Total Time spent with patient: 20 minutes  Past Psychiatric History: See H&P  Past Medical History:  Past Medical History:  Diagnosis Date  . Autism   . Developmental delay   . Impulsive personality  disorder (HCC)   . Oppositional defiant disorder    History reviewed. No pertinent surgical history. Family History: History reviewed. No pertinent family history. Family Psychiatric  History: See H&P Social History:  Social History   Substance and Sexual Activity  Alcohol Use No  . Frequency: Never     Social History   Substance and Sexual Activity  Drug Use No    Social History   Socioeconomic History  . Marital status: Single    Spouse name: None  . Number of children: None  . Years of education: None  . Highest education level: None  Social Needs  . Financial resource strain: None  . Food insecurity - worry: None  . Food insecurity - inability: None  . Transportation needs - medical: None  . Transportation needs - non-medical: None  Occupational History  . None  Tobacco Use  . Smoking status: Never Smoker  . Smokeless tobacco: Never Used  Substance and Sexual Activity  . Alcohol use: No    Frequency: Never  . Drug use: No  . Sexual activity: No  Other Topics Concern  . None  Social History Narrative  . None   Additional Social History:    Pain Medications: See PTA Prescriptions: See PTA Over the Counter: See PTA History of alcohol / drug use?: No history of alcohol / drug abuse                    Sleep: Good  Appetite:  Good  Current Medications: Current Facility-Administered Medications  Medication Dose Route Frequency Provider Last Rate Last Dose  . acetaminophen (TYLENOL) tablet 650 mg  650 mg Oral Q6H PRN Clapacs, John T, MD      . alum & mag hydroxide-simeth (MAALOX/MYLANTA) 200-200-20 MG/5ML suspension 30 mL  30 mL Oral Q4H PRN Clapacs, John T, MD      . benztropine (COGENTIN) tablet 1 mg  1 mg Oral BID Clapacs, Jackquline Denmark, MD   1 mg at 07/03/17 0903  . clonazePAM (KLONOPIN) tablet 0.5 mg  0.5 mg Oral TID Clapacs, John T, MD   0.5 mg at 07/03/17 1142  . divalproex (DEPAKOTE ER) 24 hr tablet 2,000 mg  2,000 mg Oral QHS Clapacs, John T, MD    2,000 mg at 07/02/17 2106  . gabapentin (NEURONTIN) capsule 100 mg  100 mg Oral TID Clapacs, John T, MD   100 mg at 07/03/17 1142  . haloperidol (HALDOL) tablet 5 mg  5 mg Oral BID Alyxandra Tenbrink, Ileene Hutchinson, MD      . magnesium hydroxide (MILK OF MAGNESIA) suspension 30 mL  30 mL Oral Daily PRN Clapacs, John T, MD      . metFORMIN (GLUCOPHAGE) tablet 1,000 mg  1,000 mg Oral BID WC Launi Asencio R, MD      . traZODone (DESYREL) tablet 50 mg  50 mg Oral QHS PRN Bunny Kleist, Ileene Hutchinson, MD   50 mg at 07/02/17 2106    Lab Results:  Results for orders placed or performed during the hospital encounter of 06/29/17 (from the past 48 hour(s))  Glucose, capillary     Status: None   Collection Time: 07/01/17  4:28 PM  Result Value Ref Range   Glucose-Capillary 80 65 - 99 mg/dL   Comment 1 Notify RN   Glucose, capillary     Status: Abnormal   Collection Time: 07/01/17  8:51 PM  Result Value Ref Range   Glucose-Capillary 248 (H) 65 - 99 mg/dL   Comment 1 Notify RN   Glucose, capillary     Status: Abnormal   Collection Time: 07/02/17  7:07 AM  Result Value Ref Range   Glucose-Capillary 126 (H) 65 - 99 mg/dL   Comment 1 Notify RN   Glucose, capillary     Status: Abnormal   Collection Time: 07/02/17 11:25 AM  Result Value Ref Range   Glucose-Capillary 146 (H) 65 - 99 mg/dL   Comment 1 Notify RN   Glucose, capillary     Status: Abnormal   Collection Time: 07/02/17  4:26 PM  Result Value Ref Range   Glucose-Capillary 147 (H) 65 - 99 mg/dL  Glucose, capillary     Status: Abnormal   Collection Time: 07/02/17  9:03 PM  Result Value Ref Range   Glucose-Capillary 125 (H) 65 - 99 mg/dL  Glucose, capillary     Status: Abnormal   Collection Time: 07/03/17  6:59 AM  Result Value Ref Range   Glucose-Capillary 112 (H) 65 - 99 mg/dL  Glucose, capillary     Status: Abnormal   Collection Time: 07/03/17 11:39 AM  Result Value Ref Range   Glucose-Capillary 146 (H) 65 - 99 mg/dL    Blood Alcohol level:  Lab Results   Component Value Date   ETH <10 06/28/2017   ETH <10 06/19/2017    Metabolic Disorder Labs: Lab Results  Component Value Date   HGBA1C 8.2 (H) 06/29/2017   MPG 188.64 06/29/2017   MPG 188.64 06/28/2017   No results found  for: PROLACTIN No results found for: CHOL, TRIG, HDL, CHOLHDL, VLDL, LDLCALC  Physical Findings: AIMS: Facial and Oral Movements Muscles of Facial Expression: None, normal Lips and Perioral Area: None, normal Jaw: None, normal Tongue: None, normal,Extremity Movements Upper (arms, wrists, hands, fingers): None, normal Lower (legs, knees, ankles, toes): None, normal, Trunk Movements Neck, shoulders, hips: None, normal, Overall Severity Severity of abnormal movements (highest score from questions above): None, normal Incapacitation due to abnormal movements: None, normal Patient's awareness of abnormal movements (rate only patient's report): No Awareness, Dental Status Current problems with teeth and/or dentures?: No Does patient usually wear dentures?: No  CIWA:    COWS:     Musculoskeletal: Strength & Muscle Tone: within normal limits Gait & Station: normal Patient leans: N/A  Psychiatric Specialty Exam: Physical Exam  ROS  Blood pressure 123/74, pulse 98, temperature 97.8 F (36.6 C), temperature source Oral, resp. rate 18, height 5\' 6"  (1.676 m), weight 90.7 kg (200 lb), SpO2 98 %.Body mass index is 32.28 kg/m.  General Appearance: Casual  Eye Contact:  Good  Speech:  Clear and Coherent  Volume:  Normal  Mood:  Euthymic  Affect:  Appropriate  Thought Process:  Coherent and Goal Directed  Orientation:  Full (Time, Place, and Person)  Thought Content:  Logical  Suicidal Thoughts:  No  Homicidal Thoughts:  No  Memory:  Immediate;   Fair  Judgement:  Fair  Insight:  Fair  Psychomotor Activity:  Normal  Concentration:  Concentration: Fair  Recall:  Fis[employer]Fair  Fund of Knowledge:  Fair  Language:  Fair  Akathisia:  No      Assets:  Communication  Skills Resilience Social Support  ADL's:  Intact  Cognition:  WNL  Sleep:  Number of Hours: 6.5     Treatment Plan Summary: 22 yo male admitted due to impulsive behaviors and diabetes management. Tora DuckModo has been good and he has been very calm on the unit. I did discuss with his guardian about medications changes including metformin and insulin. She was concerned about group home administering insulin. Discussed with diabetes care team and they recommend increase in Metformin as he has required very little sliding scale insulin and d/c insulin.   Plan:  Autistic spectrum disorder/Adjustment d/o -Seroquel d/c'd -Increase Haldol to 5 mg BID for behavioral management -Continue Depakote and Klonopin  Elevated BC -Under better control -after discussion with care coordinators, will d/c ss insulin -increase Metformin to 1000 mg BID  Dispo -I spoke with guardian today about medication changes and discharge back to group home tomorrow  Haskell RilingHolly R Salimah Martinovich, MD 07/03/2017, 3:17 PM

## 2017-07-03 NOTE — [facility] Group Notes (Signed)
[facility] Group Notes:  (Nursing/MHT/Case Management/Adjunct)  Date:  07/03/2017  Time:  11:22 PM  Type of Therapy:  Group Therapy  Participation Level:  Active  Participation Quality:  Appropriate  Affect:  Appropriate  Cognitive:  Alert  Insight:  Appropriate  Engagement in Group:  Engaged  Modes of Intervention:  Activity  Summary of Progress/Problems:  Jesse Mckenzie Favor 07/03/2017, 11:22 PM

## 2017-07-04 LAB — GLUCOSE, CAPILLARY: GLUCOSE-CAPILLARY: 144 mg/dL — AB (ref 65–99)

## 2017-07-04 NOTE — Progress Notes (Signed)
  [facility] :  Will you be returning to the same living situation after discharge:  Yes,  Group Home At discharge, do you have transportation home?: Yes,  Tora PerchesBilly Coleman from group home will come and get him. Do you have the ability to pay for your medications: Yes,  Insurance  Release of information consent forms completed and in the chart;  Patient's signature needed at discharge.  Patient to Follow up at: Follow-up Information    [facility] Go on 07/20/2017.   Why:  Please arrive to your scheduled appoitment on Thursday 07/20/2017 at 10am. Contact information: [address] [place] [address] [phone]        [facility] Follow up.   Why:  If you need services before your scheduled appointment please go to [facility] via walk-in clinic M-F 8am-5pm. Contact information: [address] [place] [id number] [phone]           Next level of care provider has [facility] to [facility] Link:no  Safety Planning and Suicide Prevention discussed: Yes,  Amy Gilliam Guardian  Have you used any form of tobacco in the last 30 days? (Cigarettes, Smokeless Tobacco, Cigars, and/or Pipes): No  Has patient been referred to the Quitline?: N/A patient is not a smoker  Patient has been referred for addiction treatment: N/A  Johny ShearsCassandra  Syliva Mee, LCSW 07/04/2017, 9:14 AM

## 2017-07-04 NOTE — Progress Notes (Signed)
Recreation Therapy Notes  Date: 01.15.2019  Time: 9:30 am  Location: Craft Room  Behavioral response: Appropriate  Intervention Topic: Coping skills  Discussion/Intervention: Group content on today was focused on coping skills. The group defined what coping skills are and when they can be used. Individuals described how they normally cope with thing and the coping skills they normally use. Patients expressed why it is important to cope with things and how not coping with things can affect you. The group participated in the intervention "My coping box" and made coping boxes while adding coping skills they could use in the future to the box. Clinical Observations/Feedback:  Patient came to group and defined coping skills as what you use to help with feelings. He stated that he uses music to cope with things. Individual identified his family, treatment team and family as others who can help him cope with things. Patient explained that it is important to cope with things because it helps in life.Individual identified 3 new coping skills exercise,deep breathing, dance) at the end of group that he  could use post discharge.  Dondrell Loudermilk LRT/CTRS         Carson Meche 07/04/2017 10:40 AM

## 2017-07-04 NOTE — Progress Notes (Signed)
Recreation Therapy Notes  INPATIENT RECREATION TR PLAN  Patient Details Name: PHYLLIS WHITEFIELD MRN: [id number] DOB: 06/23/1995 Today's Date: 07/04/2017  Rec Therapy Plan Is patient appropriate for Therapeutic Recreation?: Yes Treatment times per week: at least 3 Estimated Length of Stay: 5-7 days TR Treatment/Interventions: Group participation (Comment)  Discharge Criteria Pt will be discharged from therapy if:: Discharged Treatment plan/goals/alternatives discussed and agreed upon by:: Patient/family  Discharge Summary Short term goals set: Patient will attend and participate in Recreation Therapy Group Sessions x5 days.  Short term goals met: Adequate for discharge Progress toward goals comments: Groups attended Which groups?: Coping skills Reason goals not met: N/A Therapeutic equipment acquired: N/A Reason patient discharged from therapy: Discharge from hospital Pt/family agrees with progress & goals achieved: Yes Date patient discharged from therapy: 07/04/17   Delvonte Berenson 07/04/2017, 10:52 AM

## 2017-07-04 NOTE — BHH Counselor (Signed)
CSW spoke with Tora PerchesBilly Coleman at group home and he will be coming to pick up the patient at 4pm today for discharge.  Tora PerchesBilly Coleman- [phone] or Jay SchlichterVanessa Jennings [phone]   Johny Shearsassandra Letitia Sabala, MSW, [place]CSWA, LCASA 07/04/2017 9:18 AM
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
## 2017-07-04 NOTE — [facility]HH Suicide Risk Assessment (Signed)
[facility]   Principal Problem: Adjustment disorder with mixed disturbance of emotions and conduct Discharge Diagnoses:  Patient Active Problem List   Diagnosis Date Noted  . Autism [F84.0] 04/25/2017    Priority: High  . Adjustment disorder with mixed disturbance of emotions and conduct [F43.25] 04/25/2017    Priority: High  . Elevated blood sugar [R73.9] 06/29/2017  . Developmental delay [R62.50] 04/25/2017      Mental Status Per Nursing Assessment::   On Admission:  NA  Demographic Factors:  Male  Loss Factors: NA  Historical Factors: Impulsivity  Risk Reduction Factors:   Living with another person, especially a relative, Positive social support and Positive therapeutic relationship  Continued Clinical Symptoms:  None Cognitive Features That Contribute To Risk:  None    Suicide Risk:  Minimal: No identifiable suicidal ideation.  Patients presenting with no risk factors but with morbid ruminations; may be classified as minimal risk based on the severity of the depressive symptoms  Follow-up Information    [facility] Go on 07/[facility]/2017.   Why:  Please arrive to your scheduled appoitment on Thursday 07/20/2017 at 10am. Contact information: [address] [place] [address] [phone]        [facility] Follow up.   Why:  If you need services before your scheduled appointment please go to [facility] via walk-in clinic M-F 8am-5pm. Contact information: [address] [place] [address] [phone]           Plan Of Care/Follow-up recommendations: See Above  Haskell RilingHolly R Jadwiga Faidley, MD 07/04/2017, 11:17 AM

## 2017-07-04 NOTE — BHH Group Notes (Signed)
07/04/2017 1PM  Type of Therapy/Topic:  Group Therapy:  Feelings about Diagnosis  Participation Level:  Did Not Attend   Description of Group:   This group will allow patients to explore their thoughts and feelings about diagnoses they have received. Patients will be guided to explore their level of understanding and acceptance of these diagnoses. Facilitator will encourage patients to process their thoughts and feelings about the reactions of others to their diagnosis and will guide patients in identifying ways to discuss their diagnosis with significant others in their lives. This group will be process-oriented, with patients participating in exploration of their own experiences, giving and receiving support, and processing challenge from other group members.   Therapeutic Goals: 1. Patient will demonstrate understanding of diagnosis as evidenced by identifying two or more symptoms of the disorder 2. Patient will be able to express two feelings regarding the diagnosis 3. Patient will demonstrate their ability to communicate their needs through discussion and/or role play  Summary of Patient Progress: Pt was invited to attend group but chose not to attend. CSW will continue to encourage pt to attend group throughout their admission.    Therapeutic Modalities:   Cognitive Behavioral Therapy Brief Therapy   Heidi DachKelsey Marton Malizia, MSW, LCSW 07/04/2017 1:39 PM

## 2017-08-16 ENCOUNTER — Other Ambulatory Visit: Payer: Self-pay

## 2017-08-16 ENCOUNTER — Emergency Department
Admission: EM | Admit: 2017-08-16 | Discharge: 2017-08-16 | Disposition: A | Discharge status: Home / Self Care | Payer: Medicaid Other | Attending: Student in an Organized Health Care Education/Training Program | Admitting: Student in an Organized Health Care Education/Training Program

## 2017-08-16 DIAGNOSIS — F840 Autistic disorder: Secondary | ICD-10-CM | POA: Diagnosis not present

## 2017-08-16 DIAGNOSIS — F88 Other disorders of psychological development: Secondary | ICD-10-CM | POA: Diagnosis not present

## 2017-08-16 DIAGNOSIS — R45851 Suicidal ideations: Secondary | ICD-10-CM | POA: Diagnosis not present

## 2017-08-16 DIAGNOSIS — F4325 Adjustment disorder with mixed disturbance of emotions and conduct: Secondary | ICD-10-CM | POA: Diagnosis not present

## 2017-08-16 DIAGNOSIS — Z79899 Other long term (current) drug therapy: Secondary | ICD-10-CM | POA: Insufficient documentation

## 2017-08-16 DIAGNOSIS — R4689 Other symptoms and signs involving appearance and behavior: Secondary | ICD-10-CM | POA: Diagnosis not present

## 2017-08-16 DIAGNOSIS — Z7984 Long term (current) use of oral hypoglycemic drugs: Secondary | ICD-10-CM | POA: Diagnosis not present

## 2017-08-16 DIAGNOSIS — Z046 Encounter for general psychiatric examination, requested by authority: Secondary | ICD-10-CM | POA: Diagnosis present

## 2017-08-16 DIAGNOSIS — R6250 Unspecified lack of expected normal physiological development in childhood: Secondary | ICD-10-CM | POA: Diagnosis present

## 2017-08-16 LAB — COMPREHENSIVE METABOLIC PANEL
ALBUMIN: 4.5 g/dL (ref 3.5–5.0)
ALK PHOS: 55 U/L (ref 38–126)
ALT: 36 U/L (ref 17–63)
ANION GAP: 9 (ref 5–15)
AST: 35 U/L (ref 15–41)
BILIRUBIN TOTAL: 0.3 mg/dL (ref 0.3–1.2)
BUN: 10 mg/dL (ref 6–20)
CO2: 26 mmol/L (ref 22–32)
Calcium: 9.5 mg/dL (ref 8.9–10.3)
Chloride: 101 mmol/L (ref 101–111)
Creatinine, Ser: 0.85 mg/dL (ref 0.61–1.24)
GLUCOSE: 159 mg/dL — AB (ref 65–99)
POTASSIUM: 4 mmol/L (ref 3.5–5.1)
Sodium: 136 mmol/L (ref 135–145)
TOTAL PROTEIN: 7.7 g/dL (ref 6.5–8.1)

## 2017-08-16 LAB — URINE DRUG SCREEN, QUALITATIVE (ARMC ONLY)
AMPHETAMINES, UR SCREEN: NOT DETECTED
BARBITURATES, UR SCREEN: NOT DETECTED
BENZODIAZEPINE, UR SCRN: NOT DETECTED
Cannabinoid 50 Ng, Ur ~~LOC~~: NOT DETECTED
Cocaine Metabolite,Ur ~~LOC~~: NOT DETECTED
MDMA (Ecstasy)Ur Screen: NOT DETECTED
METHADONE SCREEN, URINE: NOT DETECTED
OPIATE, UR SCREEN: NOT DETECTED
Phencyclidine (PCP) Ur S: NOT DETECTED
Tricyclic, Ur Screen: POSITIVE — AB

## 2017-08-16 LAB — CBC
HEMATOCRIT: 41.2 % (ref 40.0–52.0)
Hemoglobin: 13.6 g/dL (ref 13.0–18.0)
MCH: 31.4 pg (ref 26.0–34.0)
MCHC: 32.9 g/dL (ref 32.0–36.0)
MCV: 95.4 fL (ref 80.0–100.0)
PLATELETS: 149 10*3/uL — AB (ref 150–440)
RBC: 4.32 MIL/uL — ABNORMAL LOW (ref 4.40–5.90)
RDW: 12.7 % (ref 11.5–14.5)
WBC: 5.5 10*3/uL (ref 3.8–10.6)

## 2017-08-16 LAB — SALICYLATE LEVEL

## 2017-08-16 LAB — ETHANOL

## 2017-08-16 LAB — ACETAMINOPHEN LEVEL

## 2017-08-16 NOTE — ED Notes (Signed)
Pt placed in the middle triage holding area with [employer] officer until triage can be completed.

## 2017-08-16 NOTE — ED Notes (Signed)
Pt escorted by [employer] with IVC papers/MD Roxan Hockeyobinson, RN Baruch GoldmannKineshia and [employer] Officer Nunzio CoryLyons made aware.

## 2017-08-16 NOTE — ED Notes (Signed)
Spoke with Guardian (Amy Gilliam) and advised her that patient would be discharging.

## 2017-08-16 NOTE — ED Notes (Addendum)
  Spoke with Rene KocherRegina [phone]) at group home.  She states that I need to call [phone] for patient transport. Called that # and left a VM.  Called Rene KocherRegina back and advised her to assist in contacting someone to transport patient back to group home.

## 2017-08-16 NOTE — ED Notes (Signed)
Dr Toni Amendlapacs rescinded IVC papers/MD Roxan Hockeyobinson, RN Baruch GoldmannKineshia, and [employer] officer made aware.
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
## 2017-08-16 NOTE — ED Notes (Signed)
Dr.Clapacs at bedside  

## 2017-08-16 NOTE — BH Assessment (Signed)
Patient seen by Psych MD (Dr. Toni Amendlapacs) and recommended for discharge, patient does not meet inpatient criteria.

## 2017-08-16 NOTE — ED Notes (Signed)
Pt clothes that include a jacket, black shoes, socks, keys on key ring attached to his jeans and underwear placed in a belonging bag and secured. Pt placed in burgundy paper scrubs with officer present in the room.

## 2017-08-16 NOTE — ED Notes (Signed)
Pt denies SI/HI/AVH. Group Home representative given discharge instructions. Pt states receipt of all belongings.

## 2017-08-16 NOTE — ED Notes (Signed)
Patient's legal guardian has been notified about patient's discharge.  Spoke with Group Home representative.  He is upset about patient's discharge and states he will call DSS on the facility tomorrow.
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
## 2018-01-02 ENCOUNTER — Emergency Department
Admission: EM | Admit: 2018-01-02 | Discharge: 2018-01-19 | Disposition: A | Discharge status: Home / Self Care | Payer: Medicaid Other | Attending: Emergency Medicine | Admitting: Emergency Medicine

## 2018-01-02 ENCOUNTER — Other Ambulatory Visit: Payer: Self-pay

## 2018-01-02 DIAGNOSIS — Z79899 Other long term (current) drug therapy: Secondary | ICD-10-CM | POA: Diagnosis not present

## 2018-01-02 DIAGNOSIS — F4325 Adjustment disorder with mixed disturbance of emotions and conduct: Secondary | ICD-10-CM | POA: Diagnosis not present

## 2018-01-02 DIAGNOSIS — F840 Autistic disorder: Secondary | ICD-10-CM | POA: Diagnosis not present

## 2018-01-02 DIAGNOSIS — R454 Irritability and anger: Secondary | ICD-10-CM | POA: Diagnosis present

## 2018-01-02 DIAGNOSIS — F819 Developmental disorder of scholastic skills, unspecified: Secondary | ICD-10-CM | POA: Diagnosis not present

## 2018-01-02 DIAGNOSIS — R739 Hyperglycemia, unspecified: Secondary | ICD-10-CM | POA: Insufficient documentation

## 2018-01-02 LAB — COMPREHENSIVE METABOLIC PANEL
ALT: 73 U/L — AB (ref 0–44)
ANION GAP: 11 (ref 5–15)
AST: 61 U/L — ABNORMAL HIGH (ref 15–41)
Albumin: 4.4 g/dL (ref 3.5–5.0)
Alkaline Phosphatase: 47 U/L (ref 38–126)
BUN: 13 mg/dL (ref 6–20)
CHLORIDE: 101 mmol/L (ref 98–111)
CO2: 25 mmol/L (ref 22–32)
CREATININE: 0.8 mg/dL (ref 0.61–1.24)
Calcium: 10.4 mg/dL — ABNORMAL HIGH (ref 8.9–10.3)
Glucose, Bld: 128 mg/dL — ABNORMAL HIGH (ref 70–99)
Potassium: 4.1 mmol/L (ref 3.5–5.1)
Sodium: 137 mmol/L (ref 135–145)
Total Bilirubin: 0.6 mg/dL (ref 0.3–1.2)
Total Protein: 8.2 g/dL — ABNORMAL HIGH (ref 6.5–8.1)

## 2018-01-02 LAB — ETHANOL: Alcohol, Ethyl (B): <10 mg/dL (ref <10)

## 2018-01-02 LAB — CBC
HCT: 40.3 % (ref 40.0–52.0)
Hemoglobin: 14.2 g/dL (ref 13.0–18.0)
MCH: 32.4 pg (ref 26.0–34.0)
MCHC: 35.3 g/dL (ref 32.0–36.0)
MCV: 91.9 fL (ref 80.0–100.0)
PLATELETS: 152 10*3/uL (ref 150–440)
RBC: 4.38 MIL/uL — AB (ref 4.40–5.90)
RDW: 12.7 % (ref 11.5–14.5)
WBC: 6.5 10*3/uL (ref 3.8–10.6)

## 2018-01-02 LAB — ACETAMINOPHEN LEVEL

## 2018-01-02 LAB — SALICYLATE LEVEL

## 2018-01-02 NOTE — Discharge Instructions (Addendum)
Please seek medical attention for any high fevers, chest pain, shortness of breath, change in behavior, persistent vomiting, bloody stool or any other new or concerning symptoms.  
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
## 2018-01-02 NOTE — ED Notes (Signed)
Pt. Transferred to [facility] from ED to room after screening for contraband. Report to include Situation, Background, Assessment and [facility] Pt. Oriented to unit including Q15 minute rounds as well as the security cameras for their protection. Patient is alert and oriented, warm and dry in no acute distress. Patient denies SI, HI, and AVH. Pt. Encouraged to let me know if needs arise.

## 2018-01-02 NOTE — ED Notes (Signed)
EDP to bedside at this time 

## 2018-01-02 NOTE — ED Notes (Signed)
IVC PENDING  CONSULT ?
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
## 2018-01-02 NOTE — ED Notes (Signed)
IVC reversed per Dr. Toni Amendlapacs

## 2018-01-02 NOTE — ED Notes (Signed)
Contacted pharmacy to reconcile meds.

## 2018-01-02 NOTE — ED Notes (Signed)
Notified Flow Coordinator of efforts to DC.

## 2018-01-02 NOTE — ED Notes (Signed)
Hourly rounding reveals patient sleeping in room. No complaints, stable, in no acute distress. Q15 minute rounds and monitoring via Security Cameras to continue. 

## 2018-01-02 NOTE — ED Notes (Signed)
Attempted several times to contact group home without success.

## 2018-01-02 NOTE — ED Notes (Signed)
Hourly rounding reveals patient in room. No complaints, stable, in no acute distress. Q15 minute rounds and monitoring via Security Cameras to continue. 

## 2018-01-02 NOTE — ED Notes (Signed)
Called Group Home and was told to call Birder RobsonJean Majors the owner at [phone]. Call went to voice mail and left a message [facility]
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
## 2018-01-02 NOTE — ED Notes (Addendum)
Pt dressed out in purple scrubs - pt belongings at this time are tshirt, jeans, underwear, socks and blue tennis shoes - he also has black phone charger, 2 ink pens, set of keys (3) on red key ring, [place] [employer] key ring, 5 one dollar bills, 6 dimes and 4 nickles

## 2018-01-03 DIAGNOSIS — F840 Autistic disorder: Secondary | ICD-10-CM | POA: Diagnosis not present

## 2018-01-03 MED ORDER — HALOPERIDOL 2 MG PO TABS
5.0000 mg | ORAL_TABLET | Freq: Two times a day (BID) | ORAL | Status: DC
  Administered 2018-01-03 – 2018-01-16 (×25): 5 mg via ORAL
  Filled 2018-01-03 (×6): qty 1
  Filled 2018-01-03: qty 2.5
  Filled 2018-01-03 (×3): qty 1
  Filled 2018-01-03: qty 2.5
  Filled 2018-01-03: qty 1
  Filled 2018-01-03: qty 2.5
  Filled 2018-01-03 (×4): qty 1
  Filled 2018-01-03 (×2): qty 2.5
  Filled 2018-01-03: qty 1
  Filled 2018-01-03: qty 2.5
  Filled 2018-01-03 (×2): qty 1
  Filled 2018-01-03 (×2): qty 2.5
  Filled 2018-01-03: qty 1

## 2018-01-03 MED ORDER — QUETIAPINE FUMARATE 25 MG PO TABS
300.0000 mg | ORAL_TABLET | Freq: Every day | ORAL | Status: DC
  Administered 2018-01-03 – 2018-01-18 (×16): 300 mg via ORAL
  Filled 2018-01-03 (×16): qty 1

## 2018-01-03 MED ORDER — GABAPENTIN 100 MG PO CAPS
100.0000 mg | ORAL_CAPSULE | Freq: Three times a day (TID) | ORAL | Status: DC
  Administered 2018-01-03 – 2018-01-19 (×46): 100 mg via ORAL
  Filled 2018-01-03 (×47): qty 1

## 2018-01-03 MED ORDER — CLONAZEPAM 0.5 MG PO TABS
0.5000 mg | ORAL_TABLET | Freq: Three times a day (TID) | ORAL | Status: DC
  Administered 2018-01-03 – 2018-01-19 (×46): 0.5 mg via ORAL
  Filled 2018-01-03 (×46): qty 1

## 2018-01-03 MED ORDER — METFORMIN HCL 500 MG PO TABS
1000.0000 mg | ORAL_TABLET | Freq: Two times a day (BID) | ORAL | Status: DC
  Administered 2018-01-03 – 2018-01-19 (×30): 1000 mg via ORAL
  Filled 2018-01-03 (×30): qty 2

## 2018-01-03 MED ORDER — BENZTROPINE MESYLATE 1 MG PO TABS
1.0000 mg | ORAL_TABLET | Freq: Two times a day (BID) | ORAL | Status: DC
  Administered 2018-01-03 – 2018-01-19 (×31): 1 mg via ORAL
  Filled 2018-01-03 (×31): qty 1

## 2018-01-03 MED ORDER — DIVALPROEX SODIUM ER 500 MG PO TB24
2000.0000 mg | ORAL_TABLET | Freq: Every day | ORAL | Status: DC
  Administered 2018-01-03 – 2018-01-18 (×16): 2000 mg via ORAL
  Filled 2018-01-03 (×16): qty 4

## 2018-01-03 MED ORDER — TRAZODONE HCL 50 MG PO TABS
50.0000 mg | ORAL_TABLET | Freq: Every evening | ORAL | Status: DC | PRN
  Administered 2018-01-03 – 2018-01-13 (×8): 50 mg via ORAL
  Filled 2018-01-03 (×8): qty 1
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
## 2018-01-03 NOTE — ED Notes (Signed)
Patient is awake, no signs of distress, nurse attempted to call group home for information regarding discharge, no answer, nurse did leave message with owner, nurse gave patient breakfast tray and He consumed 100% and beverage. q 15 minute checks and camera surveillance in progress for safety.

## 2018-01-03 NOTE — ED Provider Notes (Signed)
-----------------------------------------   7:17 AM on 01/03/2018 -----------------------------------------   Blood pressure 122/68, pulse (!) 127, temperature 98.8 F (37.1 C), temperature source Oral, resp. rate 16, height 5\' 9"  (1.753 m), weight 83.9 kg (185 lb), SpO2 97 %.  The patient had no acute events since last update.  Calm and cooperative at this time.  Disposition is pending Psychiatry/Behavioral Medicine team recommendations.     Irean HongSung, Viviane Semidey J, MD 01/03/18 [phone]

## 2018-01-03 NOTE — ED Notes (Signed)
Hourly rounding reveals patient sleeping in room. No complaints, stable, in no acute distress. Q15 minute rounds and monitoring via Security Cameras to continue. 
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
## 2018-01-03 NOTE — Progress Notes (Signed)
Disposition CSW received call from [place], [place] @ [facility] who requested assistance as pt's group home is refusing to allow him to come back.  CSW called [facility] and left a HiPAA compliant message for Director, Birder RobsonJean Majors [phone]).  Ms. Alysia PennaMajors called back and this writer asked if group home had begun the emergency discharge process with [employer].  Ms. Major was unsure and was not in the office.  CSW explained that patient was being d/c'd and, given that there may not be a group home discharge in process, they were obligated to come and get him, or the [facility] ED CSW would have to file a report with [facility] for abandonment.  Ms. Alysia PennaMajors expressed understanding and stated that she was going to call pt's guardian and asked if she could call back in an hour.  CSW agreed and gave her number for [facility] ED CSW, Andris FlurryJeneya M., LCSWA.  Timmothy EulerJean T. Kaylyn LimSutter, MSW, LCSWA Disposition Clinical Social Work [phone] (cell) [phone] (office)

## 2018-01-03 NOTE — ED Notes (Signed)
Hourly rounding reveals patient sleeping in room. No complaints, stable, in no acute distress. Q15 minute rounds and monitoring via Rover and Officer to continue.  

## 2018-01-03 NOTE — ED Notes (Addendum)
Hourly rounding reveals patient sleeping in room. No complaints, stable, in no acute distress. Q15 minute rounds and monitoring via Security Cameras to continue. 

## 2018-01-03 NOTE — Clinical Social Work Note (Addendum)
CSW received a call from [facility] Disposition Social Worker Heriberto AntiguaJean Sutter, stating [facility] RN Toniann FailWendy called her requesting assistance with [facility] discharge and updated this CSW of need. CSW received a call from Walden FieldVanessa Jennings-[facility] Business Manager [phone]), who stated they are preparing to send an emergency discharge letter to patient's guardian Amy Gwinda PasseGilliam. CSW asked that Emergency Discharge be faxed to this CSW also. Ms. Marlyne BeardsJennings stated it would be done in about an hour. CSW spoke with [facility]'s [facility] Social Work Guardian French Anamy Gilliam [phone] and updated her. Ms. Gwinda PasseGilliam to start searching for comparable and [place]-based group homes, as patient's [facility] program is in [place]. CSW to assist with securing placement for patient, but informed Ms. Gwinda PasseGilliam that CSW will be searching for placement outside of [place] also for first available. Ms. Gwinda PasseGilliam expressed understanding. Ms. Gwinda PasseGilliam to staff with her supervisor and call CSW back. CSW updated [facility] RN Toniann FailWendy and provided this CSW's contact information. CSW completed FL-2. CSW continuing to follow for discharge needs.   Corlis HoveJeneya Shanna Strength, Theresia MajorsLCSWA, [facility] Clinical Social Worker-Emergency Department [phone]

## 2018-01-03 NOTE — ED Notes (Signed)
Pt. In dayroom interacting with staff and patients.  Pt. Concerned about next group home.  "Will the next group home be better?"  "Will I have to go to a locked facility?"  Dr. Toni Amendlapacs spoke and reassured patient about future.  Pt. Satisfied with answer.  Pt. Denies SI and HI at this time.

## 2018-01-03 NOTE — ED Notes (Signed)
Nurse called [facility] and talked to a employee there and He states that the director Carney BernJean majors states that they are not going to take him back and gave nurse the phone number of director, [phone]. Nurse will pass on to TTS and Child psychotherapistsocial worker.

## 2018-01-03 NOTE — NC FL2 (Signed)
[place] MEDICAID FL2 LEVEL OF CARE SCREENING TOOL     IDENTIFICATION  Patient Name: FURKAN KEENUM Birthdate: 05/21/1996 Sex: male Admission Date (Current Location): 01/02/2018  [place] and [place] Number:  [address] [id number] [facility] Facility and Address:  [facility], [address], [place], [place] [address]      Provider Number: [id number]  Attending Physician Name and Address:  No att. providers found  Relative Name and Phone Number:  Legal Rosiland Oz [phone]    Current Level of Care: Hospital Recommended Level of Care: Other (Comment)(Group Home) Prior Approval Number:    Date Approved/Denied:   PASRR Number:    Discharge Plan: Other (Comment)(Group Home)    Current Diagnoses: Patient Active Problem List   Diagnosis Date Noted  . Elevated blood sugar 06/29/2017  . Autistic spectrum disorder 04/25/2017  . Developmental delay 04/25/2017  . 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    Orientation RESPIRATION BLADDER Height & Weight     Self, Situation, Place, Time  Normal Continent Weight: 185 lb (83.9 kg) Height:  5\' 9"  (175.3 cm)  BEHAVIORAL SYMPTOMS/MOOD NEUROLOGICAL BOWEL NUTRITION STATUS  Other (Comment)(Hx of physical aggression)   Continent Diet(Regular Diet)  AMBULATORY STATUS COMMUNICATION OF NEEDS Skin   Independent Verbally Normal                       Personal Care Assistance Level of Assistance  Bathing, Feeding, Dressing Bathing Assistance: Independent Feeding assistance: Independent Dressing Assistance: Independent     Functional Limitations Info  Sight, Hearing, Speech Sight Info: Adequate Hearing Info: Adequate Speech Info: Adequate    SPECIAL CARE FACTORS FREQUENCY                       Contractures Contractures Info: Not present    Additional Factors Info  Code Status, Allergies, Psychotropic Code Status Info: Full Allergies Info: No Known  Allergies Psychotropic Info: See Medication List         Current Medications (01/03/2018):  This is the current hospital active medication list No current facility-administered medications for this encounter.    Current Outpatient Medications  Medication Sig Dispense Refill  . benztropine (COGENTIN) 1 MG tablet Take 1 tablet (1 mg total) by mouth 2 (two) times daily. 60 tablet 1  . clonazePAM (KLONOPIN) 0.5 MG tablet Take 1 tablet (0.5 mg total) by mouth 3 (three) times daily. 90 tablet 1  . divalproex (DEPAKOTE ER) 500 MG 24 hr tablet Take 4 tablets (2,000 mg total) by mouth at bedtime. 120 tablet 1  . ergocalciferol (VITAMIN D2) 50000 units capsule Take 50,000 Units by mouth every Sunday.    . gabapentin (NEURONTIN) 100 MG capsule Take 1 capsule (100 mg total) by mouth 3 (three) times daily. 90 capsule 1  . haloperidol (HALDOL) 5 MG tablet Take 1 tablet (5 mg total) by mouth 2 (two) times daily. 60 tablet 0  . metFORMIN (GLUCOPHAGE) 1000 MG tablet Take 1 tablet (1,000 mg total) by mouth 2 (two) times daily with a meal. 60 tablet 0  . QUEtiapine (SEROQUEL) 300 MG tablet Take 300 mg by mouth at bedtime.    . traZODone (DESYREL) 50 MG tablet Take 1 tablet (50 mg total) by mouth at bedtime as needed for sleep. 30 tablet 1     Discharge Medications: Please see discharge summary for a list of discharge medications.  Relevant Imaging Results:  Relevant Lab Results:  Additional Information SSN: [id number]  Dominic PeaJeneya G Mercer Stallworth, LCSW

## 2018-01-04 DIAGNOSIS — F840 Autistic disorder: Secondary | ICD-10-CM | POA: Diagnosis not present

## 2018-01-04 NOTE — ED Notes (Signed)
Patient ate 100% of supper tray and beverage, no signs of distress, q 15 minute checks .

## 2018-01-04 NOTE — ED Notes (Signed)
Pt. Alert and oriented, warm and dry, in no distress. Pt. Denies SI, HI, and AVH. Pt. Encouraged to let nursing staff know of any concerns or needs. 

## 2018-01-04 NOTE — ED Provider Notes (Signed)
-----------------------------------------   2:05 PM on 01/04/2018 -----------------------------------------   Blood pressure 138/77, pulse 80, temperature 97.9 F (36.6 C), temperature source Oral, resp. rate 18, height 5\' 9"  (1.753 m), weight 83.9 kg (185 lb), SpO2 99 %.  The patient had no acute events since last update.  Calm and cooperative at this time.  Disposition is pending Psychiatry/Behavioral Medicine team recommendations.     Rockne MenghiniNorman, Anne-Caroline, MD 01/04/18 [phone]

## 2018-01-04 NOTE — ED Notes (Signed)
Pt to door talking to security officer about discharge taking too long. Pt asked to be patent and he accepted that. Pt calm and cooperative during interaction.

## 2018-01-04 NOTE — ED Notes (Signed)
Patient ate 100% of lunch and beverage.  

## 2018-01-04 NOTE — Consult Note (Signed)
[facility] Consult   Reason for Consult: Follow-up note for this 22 year old man with autistic spectrum disorder who is in the emergency room Referring Physician: Mariea Clonts Patient Identification: ROSHAUN POUND MRN:  [id number] Principal Diagnosis: Autistic spectrum disorder Diagnosis:   Patient Active Problem List   Diagnosis Date Noted  . Elevated blood sugar [R73.9] 06/29/2017  . Autistic spectrum disorder [F84.0] 04/25/2017  . Developmental delay [R62.50] 04/25/2017  . Adjustment disorder with mixed disturbance of emotions and conduct [F43.25] 04/25/2017    Total Time spent with patient: 20 minutes  Subjective:   CHLOE BAIG is a 22 y.o. male patient admitted with "I guess I am okay".  HPI: Patient seen chart reviewed.  Patient continues to feel anxious but has kept his behavior under control.  Has some cognitive impairment but is coming to be able to better remember that his predicament is that he is in the emergency room without a clear place to stay.  No specific new physical complaints.  No report of hallucinations.  Not violent or threatening.  Past Psychiatric History: Long history of agitated behavior intermittently that often whines him up in the emergency room.  Most likely diagnosis appears to be autistic spectrum disorder  Risk to Self:   Risk to Others:   Prior Inpatient Therapy:   Prior Outpatient Therapy:    Past Medical History:  Past Medical History:  Diagnosis Date  . Autism   . Developmental delay   . 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 Oppositional defiant disorder    History reviewed. No pertinent surgical history. Family History: No family history on file. Family Psychiatric  History: Not reported Social History:  Social History   Substance and Sexual Activity  Alcohol Use No  . Frequency: Never     Social History   Substance and Sexual Activity  Drug Use No    Social History   Socioeconomic History  . Marital  status: Single    Spouse name: Not on file  . Number of children: Not on file  . Years of education: Not on file  . Highest education level: Not on file  Occupational History  . Not on file  Social Needs  . Financial resource strain: Not on file  . Food insecurity:    Worry: Not on file    Inability: Not on file  . Transportation needs:    Medical: Not on file    Non-medical: Not on file  Tobacco Use  . Smoking status: Never Smoker  . Smokeless tobacco: Never Used  Substance and Sexual Activity  . Alcohol use: No    Frequency: Never  . Drug use: No  . Sexual activity: Never  Lifestyle  . Physical activity:    Days per week: Not on file    Minutes per session: Not on file  . Stress: Not on file  Relationships  . Social connections:    Talks on phone: Not on file    Gets together: Not on file    Attends religious service: Not on file    Active member of club or organization: Not on file    Attends meetings of clubs or organizations: Not on file    Relationship status: Not on file  Other Topics Concern  . Not on file  Social History Narrative  . Not on file   Additional Social History:    Allergies:  No Known Allergies  Labs:  Results for orders placed or performed during the hospital  encounter of 01/02/18 (from the past 48 hour(s))  Comprehensive metabolic panel     Status: Abnormal   Collection Time: 01/02/18  4:43 PM  Result Value Ref Range   Sodium 137 135 - 145 mmol/L   Potassium 4.1 3.5 - 5.1 mmol/L   Chloride 101 98 - 111 mmol/L    Comment: Please note change in reference range.   CO2 25 22 - 32 mmol/L   Glucose, Bld 128 (H) 70 - 99 mg/dL    Comment: Please note change in reference range.   BUN 13 6 - 20 mg/dL    Comment: Please note change in reference range.   Creatinine, Ser 0.80 0.61 - 1.24 mg/dL   Calcium 10.4 (H) 8.9 - 10.3 mg/dL   Total Protein 8.2 (H) 6.5 - 8.1 g/dL   Albumin 4.4 3.5 - 5.0 g/dL   AST 61 (H) 15 - 41 U/L   ALT 73 (H) 0 - 44  U/L    Comment: Please note change in reference range.   Alkaline Phosphatase 47 38 - 126 U/L   Total Bilirubin 0.6 0.3 - 1.2 mg/dL   GFR calc non Af Amer >60 >60 mL/min   GFR calc Af Amer >60 >60 mL/min    Comment: (NOTE) The eGFR has been calculated using the CKD EPI equation. This calculation has not been validated in all clinical situations. eGFR's persistently <60 mL/min signify possible Chronic Kidney Disease.    Anion gap 11 5 - 15    Comment: Performed at [facility], [address] [place], [place] [address]  Ethanol     Status: None   Collection Time: 01/02/18  4:43 PM  Result Value Ref Range   Alcohol, Ethyl (B) <10 <10 mg/dL    Comment: (NOTE) Lowest detectable limit for serum alcohol is 10 mg/dL. For medical purposes only. Performed at [facility], [address] [place], [place] [address]   Salicylate level     Status: None   Collection Time: 01/02/18  4:43 PM  Result Value Ref Range   Salicylate Lvl <3.8 2.8 - 30.0 mg/dL    Comment: Performed at [facility], [address] [place], [place] [address]  Acetaminophen level     Status: Abnormal   Collection Time: 01/02/18  4:43 PM  Result Value Ref Range   Acetaminophen (Tylenol), Serum <10 (L) 10 - 30 ug/mL    Comment: (NOTE) Therapeutic concentrations vary significantly. A range of 10-30 ug/mL  may be an effective concentration for many patients. However, some  are best treated at concentrations outside of this range. Acetaminophen concentrations >150 ug/mL at 4 hours after ingestion  and >50 ug/mL at 12 hours after ingestion are often associated with  toxic reactions. Performed at [facility], [address] [place], [place] [address]   cbc     Status: Abnormal   Collection Time: 01/02/18  4:43 PM  Result Value Ref Range   WBC 6.5 3.8 - 10.6 K/uL   RBC 4.38 (L) 4.40 - 5.90 MIL/uL   Hemoglobin 14.2 13.0 - 18.0 g/dL   HCT 40.3 40.0 - 52.0 %   MCV 91.9 80.0 -  100.0 fL   MCH 32.4 26.0 - 34.0 pg   MCHC 35.3 32.0 - 36.0 g/dL   RDW 12.7 11.5 - 14.5 %   Platelets 152 150 - 440 K/uL    Comment: Performed at [facility], [address] [place], [place] [address]    Current  Facility-Administered Medications  Medication Dose Route Frequency Provider Last Rate Last Dose  . benztropine (COGENTIN) tablet 1 mg  1 mg Oral BID Clapacs, Madie Reno, MD   1 mg at 01/04/18 1056  . clonazePAM (KLONOPIN) tablet 0.5 mg  0.5 mg Oral TID Clapacs, John T, MD   0.5 mg at 01/04/18 1057  . divalproex (DEPAKOTE ER) 24 hr tablet 2,000 mg  2,000 mg Oral QHS Clapacs, John T, MD   2,000 mg at 01/03/18 2125  . gabapentin (NEURONTIN) capsule 100 mg  100 mg Oral TID Clapacs, John T, MD   100 mg at 01/04/18 1057  . haloperidol (HALDOL) tablet 5 mg  5 mg Oral BID Clapacs, Madie Reno, MD   5 mg at 01/04/18 1057  . metFORMIN (GLUCOPHAGE) tablet 1,000 mg  1,000 mg Oral BID WC Clapacs, John T, MD   1,000 mg at 01/04/18 1056  . QUEtiapine (SEROQUEL) tablet 300 mg  300 mg Oral QHS Clapacs, John T, MD   300 mg at 01/03/18 2126  . traZODone (DESYREL) tablet 50 mg  50 mg Oral QHS PRN Clapacs, Madie Reno, MD   50 mg at 01/03/18 2126   Current Outpatient Medications  Medication Sig Dispense Refill  . benztropine (COGENTIN) 1 MG tablet Take 1 tablet (1 mg total) by mouth 2 (two) times daily. 60 tablet 1  . clonazePAM (KLONOPIN) 0.5 MG tablet Take 1 tablet (0.5 mg total) by mouth 3 (three) times daily. 90 tablet 1  . divalproex (DEPAKOTE ER) 500 MG 24 hr tablet Take 4 tablets (2,000 mg total) by mouth at bedtime. 120 tablet 1  . ergocalciferol (VITAMIN D2) 50000 units capsule Take 50,000 Units by mouth every Sunday.    . gabapentin (NEURONTIN) 100 MG capsule Take 1 capsule (100 mg total) by mouth 3 (three) times daily. 90 capsule 1  . haloperidol (HALDOL) 5 MG tablet Take 1 tablet (5 mg total) by mouth 2 (two) times daily. 60 tablet 0  . metFORMIN (GLUCOPHAGE) 1000 MG tablet Take 1 tablet  (1,000 mg total) by mouth 2 (two) times daily with a meal. 60 tablet 0  . QUEtiapine (SEROQUEL) 300 MG tablet Take 300 mg by mouth at bedtime.    . traZODone (DESYREL) 50 MG tablet Take 1 tablet (50 mg total) by mouth at bedtime as needed for sleep. 30 tablet 1    Musculoskeletal: Strength & Muscle Tone: within normal limits Gait & Station: normal Patient leans: N/A  Psychiatric Specialty Exam: Physical Exam  Nursing note and vitals reviewed. Constitutional: He appears well-developed and well-nourished.  HENT:  Head: Normocephalic and atraumatic.  Eyes: Pupils are equal, round, and reactive to light. Conjunctivae are normal.  Neck: Normal range of motion.  Cardiovascular: Normal heart sounds.  Respiratory: Effort normal.  GI: Soft.  Musculoskeletal: Normal range of motion.  Neurological: He is alert.  Skin: Skin is warm and dry.  Psychiatric: He has a normal mood and affect. His behavior is normal. Judgment and thought content normal.    Review of Systems  Constitutional: Negative.   HENT: Negative.   Eyes: Negative.   Respiratory: Negative.   Cardiovascular: Negative.   Gastrointestinal: Negative.   Musculoskeletal: Negative.   Skin: Negative.   Neurological: Negative.   Psychiatric/Behavioral: Negative.     Blood pressure 138/77, pulse 80, temperature 97.9 F (36.6 C), temperature source Oral, resp. rate 18, height 5' 9"  (1.753 m), weight 83.9 kg (185 lb), SpO2 99 %.Body mass index is 27.32 kg/m.  General  Appearance: Fairly Groomed  Eye Contact:  Fair  Speech:  Normal Rate  Volume:  Decreased  Mood:  Dysphoric  Affect:  Constricted  Thought Process:  Goal Directed  Orientation:  Full (Time, Place, and Person)  Thought Content:  Logical  Suicidal Thoughts:  No  Homicidal Thoughts:  No  Memory:  Immediate;   Fair Recent;   Fair Remote;   Fair  Judgement:  Impaired  Insight:  Shallow  Psychomotor Activity:  Decreased  Concentration:  Concentration: Fair   Recall:  [employer] of Knowledge:  Fair  Language:  Fair  Akathisia:  No  Handed:  Right  AIMS (if indicated):     Assets:  Desire for Improvement Physical Health  ADL's:  Impaired  Cognition:  Impaired,  Mild  Sleep:        Treatment Plan Summary: Daily contact with patient to assess and evaluate symptoms and progress in treatment, Medication management and Plan Continue current medication.  Patient has no new chief complaint.  Social work is involved in looking for placement options.  Supportive counseling with the patient review of treatment plan.  Disposition: No evidence of imminent risk to self or others at present.   Patient does not meet criteria for psychiatric inpatient admission. Supportive therapy provided about ongoing stressors.  Alethia Berthold, MD 01/04/2018 12:15 PM

## 2018-01-04 NOTE — Clinical Social Work Note (Signed)
CSW in communication with patient guardian French Anamy Gilliam re: placement. [facility] for [facility] around 2pm today. CSW continuing to follow for discharge needs.   Corlis HoveJeneya Chanta Bauers, Theresia MajorsLCSWA, [facility] Clinical Social Worker-Emergency Department [phone]

## 2018-01-05 NOTE — ED Notes (Signed)
Patient received PM snack. 

## 2018-01-05 NOTE — ED Notes (Signed)
Hourly rounding reveals patient sleeping in room. No complaints, stable, in no acute distress. Q15 minute rounds and monitoring via Security Cameras to continue. 

## 2018-01-05 NOTE — ED Notes (Signed)
Patient was a little sad, due to the fact he is the only patient left back here, other patients have been discharged. Patient refused his dinner dose of Metformin. Patient was told earlier today that he maybe interviewed by a new Group home representative and when the representative did not show up, patient became sad, and went to his room and rested. NAD noted. Denies SI/HI/AVH.

## 2018-01-05 NOTE — Progress Notes (Signed)
[facility] pm [facility] patient. Called Guardian  French Anamy Gilliam [phone] and left detailed message.  Delta Air LinesClaudine Aiyonna Lucado LCSW [phone]

## 2018-01-05 NOTE — ED Provider Notes (Signed)
-----------------------------------------   6:24 AM on 01/05/2018 -----------------------------------------   Blood pressure 134/84, pulse 74, temperature 98.5 F (36.9 C), temperature source Oral, resp. rate 20, height 1.753 m (5\' 9" ), weight 83.9 kg (185 lb), SpO2 100 %.  The patient had no acute events since last update.  Calm and cooperative at this time.  Disposition is pending social work placement.     Loleta RoseForbach, Marleen Moret, MD 01/05/18 [phone]

## 2018-01-05 NOTE — ED Notes (Signed)

## 2018-01-05 NOTE — ED Notes (Signed)
Refused vitals 

## 2018-01-05 NOTE — ED Notes (Signed)
Patient observed lying in bed with eyes closed  Even, unlabored respirations observed   NAD pt appears to be sleeping. Will continue to monitor along with every 15 minute visual observations and ongoing security monitoring

## 2018-01-05 NOTE — ED Notes (Signed)
VOL/Pending placement 

## 2018-01-05 NOTE — Consult Note (Signed)
Psychiatry: Patient seen chart reviewed.  Brief follow-up.  22 year old man with autistic spectrum disorder and developmental disability.  Patient has no new complaints.  Denies hallucinations.  Denies suicidal or homicidal thoughts.  He is bored and paces from time to time but has not been really agitated.  Patient is showing pretty good patience with the current situation.  Taking care of his hygiene adequately.  Eating well.  Vitals stable.  No change to current medication or treatment plan.  Reviewed with social worker that she is working on trying to find an appropriate disposition.  We have and [place] to form signed and ready to go if we can find a group home that would take him.

## 2018-01-05 NOTE — [place] FL2 (Signed)
[place] MEDICAID FL2 LEVEL OF CARE SCREENING TOOL     IDENTIFICATION  Patient Name: Jesse Mckenzie Birthdate: 07/18/1995 Sex: male Admission Date (Current Location): 01/02/2018  [place]  [place] [id number] [facility] Address:  [facility], [address], [place], [place] [address]      Provider Number: [id number]  Attending Physician Name and Address:  No att. providers found  Relative Name and Phone Number:  Legal Rosiland OzGuardian Amy Gilliam [phone]    Current Level of Care: Hospital Recommended Level of Care: Other (Comment)(Group Home) Prior Approval Number:    Date Approved/Denied:   PASRR Number:    Discharge Plan: Other (Comment)(Group Home)    Current Diagnoses: Patient Active Problem List   Diagnosis Date Noted  . Elevated blood sugar 06/29/2017  . Autistic spectrum disorder 04/25/2017  . Developmental delay 04/25/2017  . 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    Orientation RESPIRATION BLADDER Height & Weight     Self, Situation, Place, Time  Normal Continent Weight: 185 lb (83.9 kg) Height:  5\' 9"  (175.3 cm)  BEHAVIORAL SYMPTOMS/MOOD NEUROLOGICAL BOWEL NUTRITION STATUS  Other (Comment)(Hx of physical aggression)   Continent Diet(Regular Diet)  AMBULATORY STATUS COMMUNICATION OF NEEDS Skin   Independent Verbally Normal                       Personal Care Assistance Level of Assistance  Bathing, Feeding, Dressing Bathing Assistance: Independent Feeding assistance: Independent Dressing Assistance: Independent     Functional Limitations Info  Sight, Hearing, Speech Sight Info: Adequate Hearing Info: Adequate Speech Info: Adequate    SPECIAL CARE FACTORS FREQUENCY                       Contractures Contractures Info: Not present    Additional Factors Info  Code Status, Allergies, Psychotropic Code Status Info: Full Allergies Info: No Known  Allergies Psychotropic Info: See Medication List         Current Medications (01/05/2018):  This is the current hospital active medication list Current Facility-Administered Medications  Medication Dose Route Frequency Provider Last Rate Last Dose  . benztropine (COGENTIN) tablet 1 mg  1 mg Oral BID Clapacs, Jackquline DenmarkJohn T, MD   1 mg at 01/04/18 2141  . clonazePAM (KLONOPIN) tablet 0.5 mg  0.5 mg Oral TID Clapacs, Jackquline DenmarkJohn T, MD   0.5 mg at 01/04/18 2141  . divalproex (DEPAKOTE ER) 24 hr tablet 2,000 mg  2,000 mg Oral QHS Clapacs, John T, MD   2,000 mg at 01/04/18 2140  . gabapentin (NEURONTIN) capsule 100 mg  100 mg Oral TID Clapacs, John T, MD   100 mg at 01/04/18 2141  . haloperidol (HALDOL) tablet 5 mg  5 mg Oral BID Clapacs, Jackquline DenmarkJohn T, MD   5 mg at 01/04/18 2141  . metFORMIN (GLUCOPHAGE) tablet 1,000 mg  1,000 mg Oral BID WC Clapacs, Jackquline DenmarkJohn T, MD   1,000 mg at 01/04/18 1722  . QUEtiapine (SEROQUEL) tablet 300 mg  300 mg Oral QHS Clapacs, John T, MD   300 mg at 01/04/18 2140  . traZODone (DESYREL) tablet 50 mg  50 mg Oral QHS PRN Clapacs, Jackquline DenmarkJohn T, MD   50 mg at 01/04/18 2141   Current Outpatient Medications  Medication Sig Dispense Refill  . benztropine (COGENTIN) 1 MG tablet Take 1 tablet (1 mg total) by mouth 2 (two) times daily. 60 tablet 1  . clonazePAM (  KLONOPIN) 0.5 MG tablet Take 1 tablet (0.5 mg total) by mouth 3 (three) times daily. 90 tablet 1  . divalproex (DEPAKOTE ER) 500 MG 24 hr tablet Take 4 tablets (2,000 mg total) by mouth at bedtime. 120 tablet 1  . ergocalciferol (VITAMIN D2) 50000 units capsule Take 50,000 Units by mouth every Sunday.    . gabapentin (NEURONTIN) 100 MG capsule Take 1 capsule (100 mg total) by mouth 3 (three) times daily. 90 capsule 1  . haloperidol (HALDOL) 5 MG tablet Take 1 tablet (5 mg total) by mouth 2 (two) times daily. 60 tablet 0  . metFORMIN (GLUCOPHAGE) 1000 MG tablet Take 1 tablet (1,000 mg total) by mouth 2 (two) times daily with a meal. 60 tablet 0  .  QUEtiapine (SEROQUEL) 300 MG tablet Take 300 mg by mouth at bedtime.    . traZODone (DESYREL) 50 MG tablet Take 1 tablet (50 mg total) by mouth at bedtime as needed for sleep. 30 tablet 1     Discharge Medications: Please see discharge summary for a list of discharge medications.  Relevant Imaging Results:  Relevant Lab Results:   Additional Information SSN: [id number]  Cheron Schaumann, [place]

## 2018-01-05 NOTE — Progress Notes (Signed)
LCSW called [facility] and left 2 messages. The third time I called he did not pick up.  LCSW texted patient guardian Amy She has received signed Fl2 and will send out patient information to other care homes. ( She will be away next week but does have coverage worker) She is aware this LCSW will support any group home providers over the weekend.  No further needs at this time  Delta Air LinesClaudine Raijon Lindfors LCSW [phone]

## 2018-01-06 NOTE — Progress Notes (Signed)
LCSW called [facility] [phone] and left voice mail message. Awaiting call back. This group home provider was to come out 2x last week and did a no show. Patients guardian is aware of the no shows and will continue to actively search for a new group home.  LCSW will call and review [facility] Group homes.  [employer] Air LinesClaudine Shereece Wellborn LCSW [phone]

## 2018-01-06 NOTE — ED Notes (Signed)

## 2018-01-06 NOTE — ED Provider Notes (Signed)
-----------------------------------------   3:19 AM on 01/06/2018 -----------------------------------------   Blood pressure 134/84, pulse 74, temperature 98.5 F (36.9 C), temperature source Oral, resp. rate 20, height 5\' 9"  (1.753 m), weight 83.9 kg (185 lb), SpO2 100 %.  The patient had no acute events since last update.  Calm and cooperative at this time.  Disposition is pending Psychiatry/Behavioral Medicine team recommendations.     Merrily Brittleifenbark, Helder Crisafulli, MD 01/06/18 [phone]

## 2018-01-06 NOTE — ED Notes (Signed)
voluntary 

## 2018-01-06 NOTE — Progress Notes (Signed)
LCSW called Dr Shon HoughJ Graham Phone [phone] fax number  [phone] Explained we had a young man who needs a good home.  Director of [employer] has agreed to review patient information and Guardian Amy Gwinda PasseGilliam is on board and information has been exchanged. [employer] director will review patient information and follow up with Tonette BihariJeneya   Faxed over face sheet ,Fl2, psych notes and assessment. Weekday LCSW to call and follow up  [employer] Air LinesClaudine Momodou Consiglio LCSW [phone]

## 2018-01-06 NOTE — ED Notes (Signed)
Patient received Pm snack.

## 2018-01-06 NOTE — ED Notes (Signed)
Pt to nurses station multiple times throughout the day asking about his discharge. Pt has remained calm and has a positive attitude.  LCSW came by to speak with patient. Maintained on 15 minute checks and observation by security camera for safety.

## 2018-01-06 NOTE — ED Notes (Signed)
Patient asleep in room. No noted distress or abnormal behavior. Will continue 15 minute checks and observation by security cameras for safety. 

## 2018-01-07 NOTE — ED Notes (Signed)
Hourly rounding reveals patient sleeping in room. No complaints, stable, in no acute distress. Q15 minute rounds and monitoring via Security Cameras to continue. 

## 2018-01-07 NOTE — ED Notes (Signed)
Report to include Situation, Background, Assessment, and Recommendations received from [facility]. Patient alert, warm and dry, in no acute distress. Patient denies SI, HI, AVH and pain. Patient made aware of Q15 minute rounds and security cameras for their safety. Patient instructed to come to me with needs or concerns.

## 2018-01-07 NOTE — ED Notes (Signed)
Hourly rounding reveals patient in room. No complaints, stable, in no acute distress. Q15 minute rounds and monitoring via Security Cameras to continue. 

## 2018-01-07 NOTE — ED Provider Notes (Signed)
-----------------------------------------   7:08 AM on 01/07/2018 -----------------------------------------   Blood pressure 136/83, pulse (!) 102, temperature 98.5 F (36.9 C), temperature source Oral, resp. rate 18, height 5\' 9"  (1.753 m), weight 83.9 kg (185 lb), SpO2 98 %.  The patient had no acute events since last update.  Calm and cooperative at this time.  Disposition is pending Psychiatry/Behavioral Medicine team recommendations.     Irean HongSung, Jade J, MD 01/07/18 [phone]

## 2018-01-07 NOTE — ED Notes (Signed)
Pt VOL/ Pending placement CSW working on placement

## 2018-01-07 NOTE — ED Notes (Addendum)
Pt interacting well with select peer and staff. Doing word puzzles and looking through newspaper. Appetite is good. Denies SI/HI/AVH.

## 2018-01-08 NOTE — ED Provider Notes (Signed)
-----------------------------------------   3:21 AM on 01/08/2018 -----------------------------------------   Blood pressure 122/66, pulse (!) 112, temperature 98.2 F (36.8 C), temperature source Oral, resp. rate 19, height 5\' 9"  (1.753 m), weight 83.9 kg (185 lb), SpO2 100 %.  The patient had no acute events since last update.  Calm and cooperative at this time.  Disposition is pending Psychiatry/Behavioral Medicine team recommendations.     Merrily Brittleifenbark, Jenifer Struve, MD 01/08/18 [phone]

## 2018-01-08 NOTE — ED Notes (Signed)
Pt refused shower at this time.

## 2018-01-08 NOTE — ED Notes (Signed)
Hourly rounding reveals patient sleeping in room. No complaints, stable, in no acute distress. Q15 minute rounds and monitoring via Security Cameras to continue. 

## 2018-01-08 NOTE — ED Notes (Signed)
Report to include Situation, Background, Assessment, and Recommendations received from Dorothy RN. Patient alert and oriented, warm and dry, in no acute distress. Patient denies SI, HI, AVH and pain. Patient made aware of Q15 minute rounds and security cameras for their safety. Patient instructed to come to me with needs or concerns.  

## 2018-01-08 NOTE — ED Notes (Signed)
Pt showered after dinner

## 2018-01-08 NOTE — Clinical Social Work Note (Addendum)
This CSW has not heard back from potential new group home administrator Dr. Shon HoughJ Graham, or legal guardian, assigned social worker to follow up tomorrow.  Ervin KnackEric R. Hassan Rowannterhaus, MSW, Theresia MajorsLCSWA [phone]  01/08/2018 4:49 PM

## 2018-01-08 NOTE — ED Notes (Signed)
Hourly rounding reveals patient in room. No complaints, stable, in no acute distress. Q15 minute rounds and monitoring via Security Cameras to continue. 

## 2018-01-08 NOTE — Clinical Social Work Note (Addendum)
CSW contacted Dr. Shon HoughJ Graham, [phone] from [facility] to follow up on placement for patient.  Dr. Cheree DittoGraham, stated that he is waiting to hear back [place] [facility] [place] legal guardian.  He will follow up with them today and then notify CSW.  Group home has not decided if they will accept patient yet.  Ervin KnackEric R. Kavon Valenza, MSW, Theresia MajorsLCSWA [phone]  01/08/2018 12:44 PM

## 2018-01-08 NOTE — ED Notes (Signed)
Spoke with pt about [facility] placement per SW.

## 2018-01-08 NOTE — ED Notes (Signed)
VOL  PENDING  PLACEMENT 

## 2018-01-09 NOTE — ED Notes (Signed)
Pt. Declined snack.

## 2018-01-09 NOTE — ED Notes (Signed)
Hourly rounding reveals patient in room. No complaints, stable, in no acute distress. Q15 minute rounds and monitoring via Security Cameras to continue. 

## 2018-01-09 NOTE — ED Notes (Signed)
Writer playing [place] with patient and peer

## 2018-01-09 NOTE — ED Notes (Signed)
Hourly rounding reveals patient sleeping in room. No complaints, stable, in no acute distress. Q15 minute rounds and monitoring via Security Cameras to continue. 

## 2018-01-09 NOTE — ED Notes (Signed)
VOL/Pending Placement 

## 2018-01-09 NOTE — Clinical Social Work Note (Signed)
No new developments. Patient's legal guardian is on vacation. CSW awaiting to hear back from Dr. Shon HoughJ. Graham and guardian's coverage person. CSW continuing to follow for discharge needs.   Corlis HoveJeneya Carsyn Boster, Theresia MajorsLCSWA, [facility]ASA Clinical Social Worker-Emergency Department [phone]

## 2018-01-09 NOTE — ED Notes (Signed)
Patient continues to be anxious about his discharge, he wants to move on with a new group home, so that he can show them that he is "sorry" for what he done at the last group home. Informed patient and discussed with him that his social worker has been working on his case and when its time for discharge, we will let him know. We will keep him updated on his treatment and the plans.  Patient and writer came up with coping skills for anger. Patient says he likes music, playing his gaming system.

## 2018-01-09 NOTE — ED Notes (Signed)
Report to include Situation, Background, Assessment, and Recommendations received from Jadeka RN. Patient alert and oriented, warm and dry, in no acute distress. Patient denies SI, HI, AVH and pain. Patient made aware of Q15 minute rounds and security cameras for their safety. Patient instructed to come to me with needs or concerns. 

## 2018-01-09 NOTE — ED Provider Notes (Signed)
-----------------------------------------   7:03 AM on 01/09/2018 -----------------------------------------   Blood pressure 133/79, pulse (!) 113, temperature 98.6 F (37 C), temperature source Oral, resp. rate 16, height 5\' 9"  (1.753 m), weight 83.9 kg (185 lb), SpO2 97 %.  The patient had no acute events since last update.  Calm and cooperative at this time.  Disposition is pending Psychiatry/Behavioral Medicine team recommendations.     Irean HongSung, Jade J, MD 01/09/18 [phone]

## 2018-01-10 NOTE — ED Provider Notes (Signed)
-----------------------------------------   7:41 AM on 01/10/2018 -----------------------------------------   Blood pressure 118/66, pulse 98, temperature 98.2 F (36.8 C), temperature source Oral, resp. rate 16, height 5\' 9"  (1.753 m), weight 83.9 kg (185 lb), SpO2 98 %.  The patient had no acute events since last update.  Calm and cooperative at this time.  Disposition is pending Psychiatry/Behavioral Medicine team recommendations.     Jeanmarie PlantMcShane, James A, MD 01/10/18 [phone]

## 2018-01-10 NOTE — ED Notes (Signed)
Hourly rounding reveals patient sleeping in room. No complaints, stable, in no acute distress. Q15 minute rounds and monitoring via Security Cameras to continue. 

## 2018-01-10 NOTE — ED Notes (Signed)
Hourly rounding reveals patient in room. No complaints, stable, in no acute distress. Q15 minute rounds and monitoring via Security Cameras to continue. 

## 2018-01-10 NOTE — Clinical Social Work Note (Signed)
CSW received a call from patient's [facility] Coordinator Jesse Mckenzie [phone]). Ms. Jesse Mckenzie to visit with patient on Thursday 01/11/18.   CSW briefly spoke with Dr. Shon HoughJ Mckenzie [phone]) from [facility], who stated he wanted to talk to CSW more about this patient. However, Dr. Cheree Mckenzie stated he is [facility] and will call CSW back. CSW continuing to follow for discharge needs.   Corlis HoveJeneya Claudine Mckenzie, Jesse MajorsLCSWA, [facility] Clinical Social Worker-Emergency Department [phone]

## 2018-01-10 NOTE — ED Notes (Signed)
Pt to nurses station door asking to speak with LCSW about his discharge. RN informed patient he would be notified of any updates and needed to remain patient. Pt accepting. Maintained on 15 minute checks and observation by security camera for safety.

## 2018-01-10 NOTE — ED Notes (Signed)
Pt. In room resting watching tv.  Pt. Appears to be in a good mood.  Pt. Denies SI and HI.

## 2018-01-10 NOTE — ED Notes (Signed)
VOL/Pending Placement 

## 2018-01-10 NOTE — ED Notes (Signed)
Pt taking a shower. No behavioral issues. Maintained on 15 minute checks and observation by security camera for safety. 

## 2018-01-11 NOTE — ED Notes (Signed)
VOL  PENDING  PLACEMENT 

## 2018-01-11 NOTE — ED Provider Notes (Signed)
-----------------------------------------   7:31 AM on 01/11/2018 -----------------------------------------   Blood pressure (!) 145/75, pulse (!) 112, temperature 98.3 F (36.8 C), temperature source Oral, resp. rate 16, height 5\' 9"  (1.753 m), weight 83.9 kg (185 lb), SpO2 100 %.  The patient had no acute events since last update.  Currently awaiting social work placement into a group home.     Minna AntisPaduchowski, Nohea Kras, MD 01/11/18 [phone]

## 2018-01-11 NOTE — ED Notes (Signed)
Pt trash removed from room and linens changed.

## 2018-01-11 NOTE — ED Notes (Signed)
Pt offered lunch tray and stated he wasn't hungry. Tray placed in pt room.

## 2018-01-11 NOTE — ED Notes (Signed)
Pt brought full breakfast tray to door to throw away. Pt encouraged to eat his lunch to help him feel better. Pt shook his head in understanding and said he didn't eat his breakfast because he was sleeping and had not eaten lunch yet because he fell back asleep.

## 2018-01-11 NOTE — ED Notes (Signed)
Pt. Came to this nurse stated "I can not go to sleep".  Pt. Asked if anything was bothering him.  Pt. Had no known reason.  Pt. Advised to turn off tv in room.  Pt. Agreed to try this.

## 2018-01-11 NOTE — ED Notes (Signed)
Breakfast tray placed in pt room. Pt sleeping 

## 2018-01-11 NOTE — Clinical Social Work Note (Addendum)
CSW left a voicemail for Dr. Shon HoughJ Graham [phone]) from [facility] requesting a call back.   UPDATE: CSW received a call back from Dr. Cheree DittoGraham asking for [facility]'s [facility] worker and payor source. CSW provided contact information for [facility] Care Coordinator and informed that patient is not a waiver recipient, but does have Medicaid. Dr. Cheree DittoGraham asked that CSW have Ms. Crouse call him. CSW left voicemail for Ms. Crouse at [phone].   UPDATE: CSW received call back from Ms. Crouse stating she called Dr. Cheree DittoGraham and provided answers for questions about rate of pay and patient's payor amount. Ms. Laveda AbbeCrouse stated she would also be heading to [facility] to see patient shortly. CSW continuing to follow for discharge needs.   Corlis HoveJeneya Alexande Sheerin, Theresia MajorsLCSWA, [facility] Clinical Social Worker-Emergency Department [phone]

## 2018-01-12 MED ORDER — HYDROXYZINE HCL 25 MG PO TABS
50.0000 mg | ORAL_TABLET | Freq: Once | ORAL | Status: AC
  Administered 2018-01-12: 50 mg via ORAL
  Filled 2018-01-12: qty 2

## 2018-01-12 NOTE — Progress Notes (Signed)
Called [facility] and left message for Dr Cheree DittoGraham [phone] He did consult with Maurilio LovelyErica Crouse from [employer] and she is working on Sales promotion account executivecoordinating services and authorization. If she does not come through with correct service codes [facility] can not pick up patient.   Called Maurilio LovelyErica Crouse she reported that patient does not have an Health visitorinnovation waiver and the last request was done in 2014. He has YET to be put on the wait list, Alcario Droughtrica will consult with the utilization manager of [employer] and will call Dr Cheree DittoGraham and explain patients current SSI benefits so that [facility] can decide to accept or decline patient she will call him and this worker back later this afternoon.  Called patients guardian and left message for coverage worker French Anamy Gilliam [phone] and awaiting call back.  Delta Air LinesClaudine Orchid Glassberg LCSW [phone]

## 2018-01-12 NOTE — ED Provider Notes (Signed)
-----------------------------------------   7:31 AM on 01/12/2018 -----------------------------------------   Blood pressure 117/64, pulse 86, temperature 97.8 F (36.6 C), temperature source Oral, resp. rate 18, height 5\' 9"  (1.753 m), weight 83.9 kg (185 lb), SpO2 100 %.  The patient had no acute events since last update.  Calm and cooperative at this time.  Disposition is pending Psychiatry/Behavioral Medicine team recommendations.     Irean HongSung, Jade J, MD 01/12/18 [phone]

## 2018-01-12 NOTE — Progress Notes (Signed)
LCSW received call from care coordinator and Dr Cheree DittoGraham from [facility] has declined accepting the patient. Care coordinator will call her team members from [facility] and see about assisting with placment.  Called  rap[place] to [place] they run a day program [phone]  Maurilio Lovelyrica Crouse will continue to look for suitable housing. Called guardian coverage worker [phone] and left voice mail message awaiting call back   Ashlee Bewley WascoBandi LCSW [phone]

## 2018-01-12 NOTE — ED Notes (Signed)
Patient having difficulty sleeping.  MD contacted. New order given.

## 2018-01-12 NOTE — ED Notes (Signed)
Breakfast tray placed in pt room. Pt sleeping 

## 2018-01-12 NOTE — ED Notes (Signed)
Hourly rounding reveals patient sleeping in room. No complaints, stable, in no acute distress. Q15 minute rounds and monitoring via Security Cameras to continue. 

## 2018-01-12 NOTE — ED Notes (Signed)
Patient reports difficulty sleeping.  He was observed sleeping by staff but he was up constantly throughout the night.

## 2018-01-12 NOTE — ED Notes (Signed)
Pt given supper tray and sprite 

## 2018-01-12 NOTE — ED Notes (Signed)
Patient was offered a snack tray and refused, did accept a drink.

## 2018-01-12 NOTE — ED Notes (Signed)
VOL  PENDING  PLACEMENT 

## 2018-01-13 MED ORDER — HALOPERIDOL 5 MG PO TABS
ORAL_TABLET | ORAL | Status: AC
  Administered 2018-01-13: 5 mg via ORAL
  Filled 2018-01-13: qty 1

## 2018-01-13 MED ORDER — HALOPERIDOL 5 MG PO TABS
ORAL_TABLET | ORAL | Status: AC
  Filled 2018-01-13: qty 1

## 2018-01-13 NOTE — ED Notes (Signed)
Since shift change, pt has been resting in bed with eyes closed, with regular, unlabored respirations. No distress noted. Will continue to monitor for needs and safety.

## 2018-01-13 NOTE — ED Notes (Signed)
Patient received PM snack. 

## 2018-01-13 NOTE — ED Notes (Signed)
Voluntary/ pending placement 

## 2018-01-13 NOTE — ED Notes (Signed)
VOL/Pending Placement 

## 2018-01-13 NOTE — ED Notes (Signed)
Pt has awakened for medications but otherwise has slept, with regular, even, and unlabored respirations observed. He has been made aware of his breakfast and lunch tray but has returned to sleep. Will continue to monitor for needs and safety.

## 2018-01-13 NOTE — ED Provider Notes (Signed)
-----------------------------------------   6:55 AM on 01/13/2018 -----------------------------------------   Blood pressure 134/78, pulse (!) 105, temperature 98.1 F (36.7 C), temperature source Oral, resp. rate 18, height 5\' 9"  (1.753 m), weight 83.9 kg (185 lb), SpO2 100 %.  The patient had no acute events since last update.  Calm and cooperative at this time.  Disposition is pending Psychiatry/Behavioral Medicine team recommendations.     Irean HongSung, Rasheena Talmadge J, MD 01/13/18 [phone]

## 2018-01-13 NOTE — ED Notes (Signed)

## 2018-01-14 NOTE — ED Notes (Addendum)
[facility]. Social worker supervised visit and stated the visit went well. Awaiting feedback from [place] about whether pt was accepted there. Pt understands that he has to [facility] to decide if he will be able to go there and may or may not hear something by tomorrow. Pt has spent most of the day in his room watching tv and napping. Pleasant on approach. Denies SI/HI/AVH. Med compliant. Appetite good. No complaints voiced.

## 2018-01-14 NOTE — ED Notes (Signed)
Report to include Situation, Background, Assessment, and Recommendations received from Dorothy RN. Patient alert and oriented, warm and dry, in no acute distress. Patient denies SI, HI, AVH and pain. Patient made aware of Q15 minute rounds and security cameras for their safety. Patient instructed to come to me with needs or concerns.  

## 2018-01-14 NOTE — Progress Notes (Signed)
LCSW had group home provider Flint MelterSharon Bruce come to visit with patient. This SW facilitated meeting between patient and [facility]. Fl2 and other information was [facility] to [facility] needs. Patient was calm and engaged and truthful and understood this was just a "meet and Greet". Weekday LCSW to follow up with patient guardian, Flint MelterSharon Bruce.  LCSW did receive a text back from [facility] and he remains interested in meeting patient and will call weekday LCSW to arrange visit.  Delta Air LinesClaudine Cherie Lasalle LCSW [phone]

## 2018-01-14 NOTE — ED Notes (Signed)
Pt refused night snack but did take drink. Pt in room at this time watching tv. Will continue to monitor.

## 2018-01-14 NOTE — Progress Notes (Signed)
LCSW over the span of 2 days called numerous group homes for this patient. Primary out comes is [facility] have no male beds- or no beds and or cant meet patient needs.  Recalled Clinton SawyerByron White to see if he would reconsider and left message.   Delta Air LinesClaudine Keia Rask LCSW [phone]

## 2018-01-14 NOTE — ED Notes (Signed)
Hourly rounding reveals patient in day room. No complaints, stable, in no acute distress. Q15 minute rounds and monitoring via Security Cameras to continue. 

## 2018-01-14 NOTE — ED Notes (Signed)
Hourly rounding reveals patient in room. No complaints, stable, in no acute distress. Q15 minute rounds and monitoring via Security Cameras to continue. 

## 2018-01-14 NOTE — Progress Notes (Signed)
LCSW called Flint MelterSharon Bruce who will call this worker back this afternoon- possible [facility].  Texted Clinton SawyerByron White and he is still interested in meeting the patient LCSW is attempted to schedule a time for either today or early next week. Awaiting response.  Delta Air LinesClaudine Giulian Goldring LCSW [phone]

## 2018-01-15 NOTE — ED Notes (Signed)
Hourly rounding reveals patient sleeping in room. No complaints, stable, in no acute distress. Q15 minute rounds and monitoring via Security Cameras to continue. 

## 2018-01-15 NOTE — ED Notes (Signed)
Hourly rounding reveals patient in room. No complaints, stable, in no acute distress. Q15 minute rounds and monitoring via Security Cameras to continue. 

## 2018-01-15 NOTE — Clinical Social Work Note (Signed)
CSW received a message from group home owner Dorris Singh [phone] stating he is going to attempt to see patient today, Monday, to assess for placement.   CSW received a call from patient's [place] asking for an update. CSW provided update and asked for clarification of Ms. Crouse role as it pertains to placement. Ms. Charleston Ropes stated she does not assist with placement, but monitors until patient is placed and then makes sure services are in place for patient.   CSW also spoke with group home owner Chrystie Nose, who stated she assessed patient in person yesterday and would like to assess again. Ms. Darnell Level unsure of her decision at this time, looking at patient's age and needs compared to her current residents. Ms. Darnell Level to let CSW know decision tomorrow-Tuesday.  CSW received a call from patient's legal guardian Amy Arsenio Loader asking that patient be allowed to contact a possible placement option to complete a phone assessment. Ms. Arsenio Loader provided contact information for Josepha Pigg ([phone]) and CSW passed on information to [place] to connect patient with Ms. Torrain. CSW received confirmation from Earlie Server that patient spoke with Ms. Torrain. CSW updated Ms. Arsenio Loader and Ms. Arsenio Loader to follow up with Ms.Torrain on Tuesday.   CSW met with patient in his room to provide update, as he requested. CSW provided patient with positive reinforcement and validation of feelings. CSW continuing to follow for discharge needs.   Oretha Ellis, Latanya Presser, [place] Worker-Emergency Department [phone]

## 2018-01-15 NOTE — ED Notes (Addendum)
Hourly rounding reveals patient in room. No complaints, stable, in no acute distress. Q15 minute rounds and monitoring via Security Cameras to continue. 

## 2018-01-15 NOTE — ED Notes (Signed)
Report to include Situation, Background, Assessment, and Recommendations received from Dorothy RN. Patient alert and oriented, warm and dry, in no acute distress. Patient denies SI, HI, AVH and pain. Patient made aware of Q15 minute rounds and security cameras for their safety. Patient instructed to come to me with needs or concerns.  

## 2018-01-15 NOTE — ED Notes (Signed)
Pt has been calm and cooperative. Took a shower. Asking about his GH placement and was informed that the SW will talk to him about this. Hopeful that he will get a GH today so he can leave here. Denies SI/HI/AVH. No behavior issues. Appetite is good.

## 2018-01-15 NOTE — ED Provider Notes (Signed)
-----------------------------------------   8:07 AM on 01/15/2018 -----------------------------------------   Blood pressure 133/87, pulse (!) 110, temperature 98 F (36.7 C), temperature source Oral, resp. rate 18, height 5\' 9"  (1.753 m), weight 83.9 kg (185 lb), SpO2 100 %.  The patient had no acute events since last update.  Calm and cooperative at this time.  Disposition is pending Psychiatry/Behavioral Medicine team recommendations.     Myrna BlazerSchaevitz, Mary Hockey Matthew, MD 01/15/18 [phone]

## 2018-01-15 NOTE — ED Notes (Signed)
Hourly rounding reveals patient in rest room. No complaints, stable, in no acute distress. Q15 minute rounds and monitoring via Security Cameras to continue. 

## 2018-01-16 MED ORDER — TUBERCULIN PPD 5 UNIT/0.1ML ID SOLN
5.0000 [IU] | Freq: Once | INTRADERMAL | Status: AC
  Administered 2018-01-16: 5 [IU] via INTRADERMAL
  Filled 2018-01-16: qty 0.1

## 2018-01-16 MED ORDER — HALOPERIDOL DECANOATE 100 MG/ML IM SOLN
100.0000 mg | Freq: Once | INTRAMUSCULAR | Status: AC
  Administered 2018-01-16: 100 mg via INTRAMUSCULAR
  Filled 2018-01-16 (×2): qty 1

## 2018-01-16 NOTE — ED Notes (Signed)
VOL/ Pending pick-up for placement to Group home on 01/19/18

## 2018-01-16 NOTE — ED Notes (Signed)
Hourly rounding reveals patient in room. No complaints, stable, in no acute distress. Q15 minute rounds and monitoring via Security Cameras to continue. 

## 2018-01-16 NOTE — ED Notes (Signed)
Hourly rounding reveals patient sleeping in room. No complaints, stable, in no acute distress. Q15 minute rounds and monitoring via Security Cameras to continue. 

## 2018-01-16 NOTE — ED Notes (Signed)
Spoke with Jorje GuildErica Crotts at [facility], she wanted a Museum/gallery exhibitions officerverbal list of patients medications

## 2018-01-16 NOTE — ED Notes (Signed)
Called pharmacy to inform patient was needing to receive a Tuberculosis test. Pharmacy stated they will be down in a few to bring the solution.

## 2018-01-16 NOTE — Clinical Social Work Note (Addendum)
CSW received a call from patient's legal guardian Jesse Mckenzie [phone]) stating Jesse Mckenzie-owner of [facility] group home is willing to make an offer if patient can receive a Haldol injection vs. po Haldol. CSW staffed with Dr. Toni Amendlapacs, who is agreeable to Haldol injection, as patient is already on po Haldol. CSW updated Ms. Gwinda PasseGilliam, who informed CSW that Ms. Mckenzie will pick up patient on Friday 8/2 at 8:30am. CSW updated EDP Dr. Roxan Hockeyobinson and asked that an order for a TB test be placed today. CSW updated BHU RN AdrianJadeka.   UPDATE: CSW received a call from group home owner Jesse Mckenzie, stating she will need prescriptions for a 1 month supply of all medication, FL-2, and discharge paperwork. Ms. Gala Lewandowskyorain to call CSW on Wednesday to confirm if pickup will be Thursday evening or Friday morning. CSW left update voicemail for guardian Jesse Mckenzie. CSW received a call from [facility] Care Coordinator Jesse Droughtrica Crouse-provided update. CSW continuing to follow for discharge needs.  Corlis HoveJeneya Jamila Slatten, Theresia MajorsLCSWA, [facility]ASA Clinical Social Worker-Emergency Department [phone]

## 2018-01-16 NOTE — ED Notes (Signed)
Hourly rounding reveals patient in room. No complaints, stable, in no acute distress. Q15 minute rounds and monitoring via Security Cameras to continue.Snack and beverage given. 

## 2018-01-16 NOTE — ED Provider Notes (Signed)
-----------------------------------------   7:22 AM on 01/16/2018 -----------------------------------------   Blood pressure 132/81, pulse 98, temperature 98.2 F (36.8 C), temperature source Oral, resp. rate 16, height 1.753 m (5\' 9" ), weight 83.9 kg (185 lb), SpO2 100 %.  The patient had no acute events since last update.  Calm and cooperative at this time.  Disposition is pending Social work assistance for group home placement    Loleta RoseForbach, Alvine Mostafa, MD 01/16/18 [phone]

## 2018-01-16 NOTE — ED Notes (Signed)
Report to include Situation, Background, Assessment, and Recommendations received from Jadeka RN. Patient alert and oriented, warm and dry, in no acute distress. Patient denies SI, HI, AVH and pain. Patient made aware of Q15 minute rounds and security cameras for their safety. Patient instructed to come to me with needs or concerns. 

## 2018-01-17 MED ORDER — ACETAMINOPHEN 325 MG PO TABS
650.0000 mg | ORAL_TABLET | Freq: Four times a day (QID) | ORAL | Status: DC | PRN
  Administered 2018-01-17: 650 mg via ORAL
  Filled 2018-01-17: qty 2

## 2018-01-17 NOTE — ED Notes (Signed)
Hourly rounding reveals patient in room. No complaints, stable, in no acute distress. Q15 minute rounds and monitoring via Security Cameras to continue. 

## 2018-01-17 NOTE — ED Notes (Signed)
Hourly rounding reveals patient sleeping in room. No complaints, stable, in no acute distress. Q15 minute rounds and monitoring via Security Cameras to continue. 

## 2018-01-17 NOTE — ED Notes (Signed)
Patient ate 100% of supper and beverage.  

## 2018-01-17 NOTE — ED Provider Notes (Signed)
-----------------------------------------   6:43 AM on 01/17/2018 -----------------------------------------   Blood pressure 135/81, pulse 98, temperature 98.3 F (36.8 C), temperature source Oral, resp. rate 16, height 5\' 9"  (1.753 m), weight 83.9 kg (185 lb), SpO2 99 %.  The patient had no acute events since last update.  Calm and cooperative at this time.  Disposition is pending Psychiatry/Behavioral Medicine/clinical social work team recommendations.     Irean HongSung, Graysen Woodyard J, MD 01/17/18 [phone]

## 2018-01-17 NOTE — Clinical Social Work Note (Signed)
UPDATE: CSW left a voicemail for group home owner Lucia BitterBrenda Torain inquiring about pick-up date/time. CSW to follow up with Ms. Torain and Guardian French Anamy Gilliam Thursday morning to confirm pickup date. Thursday evening vs. Friday morning. CSW continuing to follow for discharge needs.  Corlis HoveJeneya Irving Bloor, Theresia MajorsLCSWA, [facility] Clinical Social Worker-Emergency Department [phone]

## 2018-01-17 NOTE — ED Notes (Signed)
Pt. In dayroom.  Pt. Excited about possibly going to new group home.  Pt. Denies SI or HI at this time.

## 2018-01-17 NOTE — ED Notes (Signed)
VOL  PENDING  PLACEMENT 

## 2018-01-18 LAB — TB SKIN TEST
Induration: 0 mm
TB Skin Test: NEGATIVE

## 2018-01-18 NOTE — ED Notes (Signed)
PPD was negative with zero induration when read at 1845 by this Clinical research associatewriter.  Melissa NoonKaren Gusta Marksberry RN 01/18/2018 7:04 PM

## 2018-01-18 NOTE — ED Notes (Signed)
Hourly rounding reveals patient sleeping in room. No complaints, stable, in no acute distress. Q15 minute rounds and monitoring via Security Cameras to continue. 

## 2018-01-18 NOTE — ED Notes (Signed)
Hourly rounding reveals patient in room. No complaints, stable, in no acute distress. Q15 minute rounds and monitoring via Security Cameras to continue. 

## 2018-01-18 NOTE — ED Notes (Signed)
VOL/ Pt being picked up tomorrow by [facility]

## 2018-01-18 NOTE — Progress Notes (Signed)
Clinical Child psychotherapistocial Worker (CSW) received a call from [place] patient's [facility] Care Coordinator and made her aware plan is for patient to D/C to "[facility] group home tomorrow morning.   Baker Hughes IncorporatedBailey Izzabella Besse, LCSW [phone]

## 2018-01-18 NOTE — ED Notes (Signed)
Pt is pleasant and cooperative this a.m. He has eaten and showered. Pt remains safe with checks, rounding, and camera monitoring in place.

## 2018-01-18 NOTE — Clinical Social Work Note (Signed)
CSW called Lucia BitterBrenda Torain, owner of "[facility] group home [phone] inquiring about pick-up date/time for patient again. Ms. Gala Lewandowskyorain stating patient will be picked up on Friday morning 8/2 at "about 9:30am." Please refrain from telling patient the exact time of pickup, in the event the time changes. CSW left HIPPA friendly update on guardian Shauna Hughmy Gilliam's [phone] voicemail. CSW updated BHU RN Clydie BraunKaren and confirmed Haldol shot was given to patient on 7/30 and TB test to be read today after 6pm. CSW to update FL-2 on Friday and EDP/Psychiatrist to assure prescriptions for all meds are ready. CSW continuing to follow for discharge needs.   Corlis HoveJeneya Farris Blash, Theresia MajorsLCSWA, [facility] Clinical Social Worker-Emergency Department [phone]

## 2018-01-18 NOTE — ED Provider Notes (Signed)
-----------------------------------------   6:00 AM on 01/18/2018 -----------------------------------------   Blood pressure (!) 148/87, pulse (!) 111, temperature 98.2 F (36.8 C), temperature source Oral, resp. rate 18, height 1.753 m (5\' 9" ), weight 83.9 kg (185 lb), SpO2 100 %.  The patient had no acute events since last update.  Calm and cooperative at this time.  Pending social work placement    Loleta RoseForbach, Mayana Irigoyen, MD 01/18/18 0600

## 2018-01-18 NOTE — ED Notes (Signed)
Report to include Situation, Background, Assessment, and Recommendations received from Warnell BureauSusan Shugart RN. Patient alert and oriented, warm and dry, in no acute distress. Patient denies SI, HI, AVH and pain. Patient made aware of Q15 minute rounds and security cameras for their safety. Patient instructed to come to me with needs or concerns.

## 2018-01-19 MED ORDER — HALOPERIDOL 5 MG PO TABS: 5.0000 mg | ORAL_TABLET | Freq: Two times a day (BID) | ORAL | 1 refills | Status: AC

## 2018-01-19 MED ORDER — HALOPERIDOL DECANOATE 100 MG/ML IM SOLN
100.0000 mg | INTRAMUSCULAR | Status: DC
Start: 2018-02-16 — End: 2018-01-19

## 2018-01-19 MED ORDER — ERGOCALCIFEROL 1.25 MG (50000 UT) PO CAPS: 50000.0000 [IU] | ORAL_CAPSULE | ORAL | 1 refills | Status: AC

## 2018-01-19 MED ORDER — CLONAZEPAM 0.5 MG PO TABS: 0.5000 mg | ORAL_TABLET | Freq: Three times a day (TID) | ORAL | 1 refills | Status: AC

## 2018-01-19 MED ORDER — DIVALPROEX SODIUM ER 500 MG PO TB24: 2000.0000 mg | ORAL_TABLET | Freq: Every day | ORAL | 1 refills | Status: AC

## 2018-01-19 MED ORDER — HALOPERIDOL DECANOATE 100 MG/ML IM SOLN: 100.0000 mg | INTRAMUSCULAR | 1 refills | Status: AC

## 2018-01-19 MED ORDER — GABAPENTIN 100 MG PO CAPS: 100.0000 mg | ORAL_CAPSULE | Freq: Three times a day (TID) | ORAL | 1 refills | Status: AC

## 2018-01-19 MED ORDER — BENZTROPINE MESYLATE 1 MG PO TABS: 1.0000 mg | ORAL_TABLET | Freq: Two times a day (BID) | ORAL | 1 refills | Status: AC

## 2018-01-19 MED ORDER — METFORMIN HCL 1000 MG PO TABS: 1000.0000 mg | ORAL_TABLET | Freq: Two times a day (BID) | ORAL | 1 refills | Status: AC

## 2018-01-19 NOTE — ED Notes (Signed)
Hourly rounding reveals patient in room. No complaints, stable, in no acute distress. Q15 minute rounds and monitoring via Security Cameras to continue. 

## 2018-01-19 NOTE — ED Notes (Signed)
Pt discharged to new group home in care of Lucia BitterBrenda Torain.  FL2, discharge paperwork, prescriptions and nurses note regarding PPD all given to Ms. Torain. VS stable. LCSW has contacted legal guardian.

## 2018-01-19 NOTE — ED Notes (Signed)
Hourly rounding reveals patient sleeping in room. No complaints, stable, in no acute distress. Q15 minute rounds and monitoring via Security Cameras to continue. 

## 2018-01-19 NOTE — Progress Notes (Signed)
LCSW consulted with EDP and he will DC the patient.  LCSW provided all forms to ED RN on patient chart and called patients guardian French Anamy Gilliam and she reported Group home Provider will be at our facility shortly to pick up patient.  No further social worker needs  Delta Air LinesClaudine Aubreigh Fuerte LCSW [phone]

## 2018-01-19 NOTE — Progress Notes (Signed)
LCSW updated Fl2 and requested a signiture from Dr Toni Amendlapacs, and EDRN paged Dr Toni Amendlapacs to have him sign off the [facility] and write scripts for patient. LCSW was informed patient would be picked up by 9:30am by [facility]  The TB test result was printed and on patient chart.  Jesse Taboada LCSW

## 2018-01-19 NOTE — [place] FL2 (Signed)
[place] MEDICAID FL2 LEVEL OF CARE SCREENING TOOL     IDENTIFICATION  Patient Name: Jesse Mckenzie Birthdate: 09/13/1995 Sex: male Admission Date (Current Location): 01/02/2018  [place]  [place] [id number] [facility] Facility and Address:  [facility], [address], [place], [place] [address]      Provider Number: [id number]  Attending Physician Name and Address:  No att. providers found  Relative Name and Phone Number:  Legal Rosiland OzGuardian Amy Gilliam [phone]    Current Level of Care: Hospital Recommended Level of Care: Other (Comment)(Group Home) Prior Approval Number:    Date Approved/Denied:   PASRR Number:    Discharge Plan: Other (Comment)(Group Home)    Current Diagnoses: Patient Active Problem List   Diagnosis Date Noted  . Elevated blood sugar 06/29/2017  . Autistic spectrum disorder 04/25/2017  . Developmental delay 04/25/2017  . 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    Orientation RESPIRATION BLADDER Height & Weight     Self, Situation, Place, Time  Normal Continent Weight: 185 lb (83.9 kg) Height:  5\' 9"  (175.3 cm)  BEHAVIORAL SYMPTOMS/MOOD NEUROLOGICAL BOWEL NUTRITION STATUS  Other (Comment)(Hx of physical aggression)   Continent Diet(Regular Diet)  AMBULATORY STATUS COMMUNICATION OF NEEDS Skin   Independent Verbally Normal                       Personal Care Assistance Level of Assistance  Bathing, Feeding, Dressing Bathing Assistance: Independent Feeding assistance: Independent Dressing Assistance: Independent     Functional Limitations Info  Sight, Hearing, Speech Sight Info: Adequate Hearing Info: Adequate Speech Info: Adequate    SPECIAL CARE FACTORS FREQUENCY                       Contractures Contractures Info: Not present    Additional Factors Info  Code Status, Allergies, Psychotropic Code Status Info: Full Allergies Info: No Known  Allergies Psychotropic Info: See Medication List         Current Medications (01/19/2018):  This is the current hospital active medication list Current Facility-Administered Medications  Medication Dose Route Frequency Provider Last Rate Last Dose  . acetaminophen (TYLENOL) tablet 650 mg  650 mg Oral Q6H PRN Arnaldo NatalMalinda, Paul F, MD   650 mg at 01/17/18 1039  . benztropine (COGENTIN) tablet 1 mg  1 mg Oral BID Clapacs, Jackquline DenmarkJohn T, MD   1 mg at 01/18/18 2124  . clonazePAM (KLONOPIN) tablet 0.5 mg  0.5 mg Oral TID Clapacs, Jackquline DenmarkJohn T, MD   0.5 mg at 01/18/18 2124  . divalproex (DEPAKOTE ER) 24 hr tablet 2,000 mg  2,000 mg Oral QHS Clapacs, John T, MD   2,000 mg at 01/18/18 2123  . gabapentin (NEURONTIN) capsule 100 mg  100 mg Oral TID Clapacs, Jackquline DenmarkJohn T, MD   100 mg at 01/18/18 2125  . metFORMIN (GLUCOPHAGE) tablet 1,000 mg  1,000 mg Oral BID WC Clapacs, Jackquline DenmarkJohn T, MD   1,000 mg at 01/18/18 1835  . QUEtiapine (SEROQUEL) tablet 300 mg  300 mg Oral QHS Clapacs, John T, MD   300 mg at 01/18/18 2124  . traZODone (DESYREL) tablet 50 mg  50 mg Oral QHS PRN Clapacs, Jackquline DenmarkJohn T, MD   50 mg at 01/13/18 2155   Current Outpatient Medications  Medication Sig Dispense Refill  . benztropine (COGENTIN) 1 MG tablet Take 1 tablet (1 mg total) by mouth 2 (two) times daily. 60 tablet 1  .  clonazePAM (KLONOPIN) 0.5 MG tablet Take 1 tablet (0.5 mg total) by mouth 3 (three) times daily. 90 tablet 1  . divalproex (DEPAKOTE ER) 500 MG 24 hr tablet Take 4 tablets (2,000 mg total) by mouth at bedtime. 120 tablet 1  . ergocalciferol (VITAMIN D2) 50000 units capsule Take 50,000 Units by mouth every Sunday.    . gabapentin (NEURONTIN) 100 MG capsule Take 1 capsule (100 mg total) by mouth 3 (three) times daily. 90 capsule 1  . haloperidol (HALDOL) 5 MG tablet Take 1 tablet (5 mg total) by mouth 2 (two) times daily. 60 tablet 0  . metFORMIN (GLUCOPHAGE) 1000 MG tablet Take 1 tablet (1,000 mg total) by mouth 2 (two) times daily with a meal. 60  tablet 0  . QUEtiapine (SEROQUEL) 300 MG tablet Take 300 mg by mouth at bedtime.    . traZODone (DESYREL) 50 MG tablet Take 1 tablet (50 mg total) by mouth at bedtime as needed for sleep. 30 tablet 1     Discharge Medications: Please see discharge summary for a list of discharge medications.  Relevant Imaging Results:  Relevant Lab Results:   Additional Information SSN: [id number]  Cheron [place], [place]

## 2018-01-19 NOTE — ED Provider Notes (Signed)
-----------------------------------------   12:07 AM on 01/19/2018 -----------------------------------------   Blood pressure 126/73, pulse 97, temperature 97.7 F (36.5 C), temperature source Oral, resp. rate 16, height 5\' 9"  (1.753 m), weight 83.9 kg (185 lb), SpO2 97 %.  The patient had no acute events since last update.  Calm and cooperative at this time.  Disposition is pending SW eval.   Merrily Brittleifenbark, Ruari Mudgett, MD 01/19/18 [id number]

## 2018-01-19 NOTE — ED Notes (Signed)
Hourly rounding reveals patient in day room. No complaints, stable, in no acute distress. Q15 minute rounds and monitoring via Security Cameras to continue. 

## 2018-04-24 ENCOUNTER — Encounter: Payer: Self-pay | Admitting: Emergency Medicine

## 2018-04-24 ENCOUNTER — Other Ambulatory Visit: Payer: Self-pay

## 2018-04-24 ENCOUNTER — Emergency Department
Admission: EM | Admit: 2018-04-24 | Discharge: 2018-04-26 | Disposition: A | Discharge status: Home / Self Care | Payer: Medicaid Other | Attending: Emergency Medicine | Admitting: Emergency Medicine

## 2018-04-24 DIAGNOSIS — F840 Autistic disorder: Secondary | ICD-10-CM | POA: Diagnosis not present

## 2018-04-24 DIAGNOSIS — F4325 Adjustment disorder with mixed disturbance of emotions and conduct: Secondary | ICD-10-CM | POA: Diagnosis not present

## 2018-04-24 DIAGNOSIS — F849 Pervasive developmental disorder, unspecified: Secondary | ICD-10-CM | POA: Diagnosis present

## 2018-04-24 DIAGNOSIS — R451 Restlessness and agitation: Secondary | ICD-10-CM | POA: Insufficient documentation

## 2018-04-24 DIAGNOSIS — R6250 Unspecified lack of expected normal physiological development in childhood: Secondary | ICD-10-CM | POA: Diagnosis present

## 2018-04-24 DIAGNOSIS — Z7984 Long term (current) use of oral hypoglycemic drugs: Secondary | ICD-10-CM | POA: Insufficient documentation

## 2018-04-24 DIAGNOSIS — R4689 Other symptoms and signs involving appearance and behavior: Secondary | ICD-10-CM | POA: Insufficient documentation

## 2018-04-24 DIAGNOSIS — Z79899 Other long term (current) drug therapy: Secondary | ICD-10-CM | POA: Insufficient documentation

## 2018-04-24 LAB — COMPREHENSIVE METABOLIC PANEL
ALT: 30 U/L (ref 0–44)
AST: 30 U/L (ref 15–41)
Albumin: 4.6 g/dL (ref 3.5–5.0)
Alkaline Phosphatase: 42 U/L (ref 38–126)
Anion gap: 10 (ref 5–15)
BUN: 11 mg/dL (ref 6–20)
CALCIUM: 10 mg/dL (ref 8.9–10.3)
CHLORIDE: 101 mmol/L (ref 98–111)
CO2: 30 mmol/L (ref 22–32)
CREATININE: 0.85 mg/dL (ref 0.61–1.24)
GFR calc non Af Amer: >60 mL/min (ref >60)
Glucose, Bld: 102 mg/dL — ABNORMAL HIGH (ref 70–99)
POTASSIUM: 4.3 mmol/L (ref 3.5–5.1)
Sodium: 141 mmol/L (ref 135–145)
Total Bilirubin: 0.5 mg/dL (ref 0.3–1.2)
Total Protein: 8.2 g/dL — ABNORMAL HIGH (ref 6.5–8.1)

## 2018-04-24 LAB — CBC WITH DIFFERENTIAL/PLATELET
Abs Immature Granulocytes: 0.03 10*3/uL (ref 0.00–0.07)
Basophils Absolute: 0 10*3/uL (ref 0.0–0.1)
Basophils Relative: 0 %
EOS PCT: 1 %
Eosinophils Absolute: 0 10*3/uL (ref 0.0–0.5)
HEMATOCRIT: 41.4 % (ref 39.0–52.0)
HEMOGLOBIN: 14.1 g/dL (ref 13.0–17.0)
Immature Granulocytes: 1 %
LYMPHS ABS: 2.1 10*3/uL (ref 0.7–4.0)
LYMPHS PCT: 33 %
MCH: 32 pg (ref 26.0–34.0)
MCHC: 34.1 g/dL (ref 30.0–36.0)
MCV: 93.9 fL (ref 80.0–100.0)
MONO ABS: 0.6 10*3/uL (ref 0.1–1.0)
MONOS PCT: 10 %
NEUTROS ABS: 3.6 10*3/uL (ref 1.7–7.7)
Neutrophils Relative %: 55 %
Platelets: 138 10*3/uL — ABNORMAL LOW (ref 150–400)
RBC: 4.41 MIL/uL (ref 4.22–5.81)
RDW: 11.9 % (ref 11.5–15.5)
WBC: 6.5 10*3/uL (ref 4.0–10.5)
nRBC: 0 % (ref 0.0–0.2)

## 2018-04-24 LAB — ETHANOL: Alcohol, Ethyl (B): <10 mg/dL (ref <10)

## 2018-04-24 NOTE — ED Notes (Signed)

## 2018-04-24 NOTE — ED Triage Notes (Addendum)
Patient to ED with [employer]. Per [employer], patient was seen by doctor today and told he needed inpatient intensive care. Patient became agitated with staff and verbalizing threats when they did not give him the answer he wanted. Per staff member, patient followed her around the kitchen and into a room where she tried to close the door and call 911. Patient got into room and started to interfere with phone call. Upon arrival to ED, patient is calm and cooperative with police and staff. Denies SI/HI. Per [employer], magistrate does not want to IVC patient and states that will be left up to ED providers.

## 2018-04-24 NOTE — ED Notes (Signed)
"  I just want another group home - I was told that I needed a higher level of care - I want another group home  - my guardian does not know but I want my meds changed and another group home."

## 2018-04-24 NOTE — ED Provider Notes (Signed)
[facility] Emergency Department Provider Note  Time seen: 7:08 PM  I have reviewed the triage vital signs and the nursing notes.   HISTORY  Chief Complaint Aggressive Behavior    HPI Jesse Mckenzie is a 22 y.o. male with a past medical history of developmental delay, autism, ODD presents from his group home for aggressive behavior.  According to report patient became agitated and aggressive at his group home, they called EMS to bring the patient to the emergency department for evaluation.  Here the patient is calm and cooperative, no distress.  Denies any medical complaints.  No alcohol or drug use.   Past Medical History:  Diagnosis Date  . Autism   . Developmental delay   . Impulsive personality disorder (HCC)   . Oppositional defiant disorder     Patient Active Problem List   Diagnosis Date Noted  . Elevated blood sugar 06/29/2017  . Autistic spectrum disorder 04/25/2017  . Developmental delay 04/25/2017  . 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 No pertinent surgical history.  Prior to Admission medications   Medication Sig Start Date End Date Taking? Authorizing Provider  benztropine (COGENTIN) 1 MG tablet Take 1 tablet (1 mg total) by mouth 2 (two) times daily. 01/19/18   Clapacs, Jackquline Denmark, MD  clonazePAM (KLONOPIN) 0.5 MG tablet Take 1 tablet (0.5 mg total) by mouth 3 (three) times daily. 01/19/18   Clapacs, Jackquline Denmark, MD  divalproex (DEPAKOTE ER) 500 MG 24 hr tablet Take 4 tablets (2,000 mg total) by mouth at bedtime. 01/19/18   Clapacs, Jackquline Denmark, MD  ergocalciferol (VITAMIN D2) 50000 units capsule Take 1 capsule (50,000 Units total) by mouth every Sunday. 01/21/18   Clapacs, Jackquline Denmark, MD  gabapentin (NEURONTIN) 100 MG capsule Take 1 capsule (100 mg total) by mouth 3 (three) times daily. 01/19/18   Clapacs, Jackquline Denmark, MD  haloperidol (HALDOL) 5 MG tablet Take 1 tablet (5 mg total) by mouth 2 (two) times  daily. 01/19/18   Clapacs, Jackquline Denmark, MD  haloperidol decanoate (HALDOL DECANOATE) 100 MG/ML injection Inject 1 mL (100 mg total) into the muscle every 30 (thirty) days. 02/16/18   Clapacs, Jackquline Denmark, MD  metFORMIN (GLUCOPHAGE) 1000 MG tablet Take 1 tablet (1,000 mg total) by mouth 2 (two) times daily with a meal. 01/19/18   Clapacs, Jackquline Denmark, MD  QUEtiapine (SEROQUEL) 300 MG tablet Take 300 mg by mouth at bedtime.    [provider]  traZODone (DESYREL) 50 MG tablet Take 1 tablet (50 mg total) by mouth at bedtime as needed for sleep. 05/10/17   Clapacs, Jackquline Denmark, MD    No Known Allergies  No family history on file.  Social History Social History   Tobacco Use  . Smoking status: Never Smoker  . Smokeless tobacco: Never Used  Substance Use Topics  . Alcohol use: No    Frequency: Never  . Drug use: No    Review of Systems Constitutional: Negative for fever. Cardiovascular: Negative for chest pain. Respiratory: Negative for shortness of breath. Gastrointestinal: Negative for abdominal pain, vomiting  Genitourinary: Negative for urinary compaints Musculoskeletal: Negative for musculoskeletal complaints Skin: Negative for skin complaints  Neurological: Negative for headache All other ROS negative  ____________________________________________   PHYSICAL EXAM:  VITAL SIGNS: ED Triage Vitals  Enc Vitals Group     BP 04/24/18 1739 125/82     Pulse Rate 04/24/18 1739 97  Resp 04/24/18 1739 20     Temp 04/24/18 1739 98.4 F (36.9 C)     Temp Source 04/24/18 1739 Oral     SpO2 04/24/18 1739 100 %     Weight 04/24/18 1739 191 lb (86.6 kg)     Height 04/24/18 1739 5\' 7"  (1.702 m)     Head Circumference --      Peak Flow --      Pain Score 04/24/18 1752 0     Pain Loc --      Pain Edu? --      Excl. in GC? --    Constitutional: Alert. Well appearing and in no distress. Eyes: Normal exam ENT   Head: Normocephalic and atraumatic.   Mouth/Throat: Mucous membranes are  moist. Cardiovascular: Normal rate, regular rhythm. No murmur Respiratory: Normal respiratory effort without tachypnea nor retractions. Breath sounds are clear Gastrointestinal: Soft and nontender. No distention.   Musculoskeletal: Nontender with normal range of motion in all extremities.  Neurologic:  Normal speech and language. No gross focal neurologic deficits Skin:  Skin is warm, dry and intact.  Psychiatric: Mood and affect are normal.   ____________________________________________   INITIAL IMPRESSION / ASSESSMENT AND PLAN / ED COURSE  Pertinent labs & imaging results that were available during my care of the patient were reviewed by me and considered in my medical decision making (see chart for details).  Patient presents to the emergency department for aggressive behavior/agitation as group home, now calm and cooperative.  We will have psychiatry see the patient.  We will check labs.  Patient denies SI or HI, does not appear to meet IVC criteria at this time.  Patient agreeable to voluntary evaluation.  Labs are largely within normal limits.  Urine drug screen pending.  Awaiting psychiatric evaluation.  ____________________________________________   FINAL CLINICAL IMPRESSION(S) / ED DIAGNOSES  Agitation Aggressive behavior    Minna Antis, MD 04/24/18 2324

## 2018-04-24 NOTE — ED Notes (Signed)
Pt. Alert and oriented, warm and dry, in no distress. Pt. Denies SI, HI, and AVH. Pt. Encouraged to let nursing staff know of any concerns or needs. 

## 2018-04-25 DIAGNOSIS — F4325 Adjustment disorder with mixed disturbance of emotions and conduct: Secondary | ICD-10-CM

## 2018-04-25 NOTE — BH Assessment (Addendum)
This Clinical research associate spoke with Amy [facility] DSS - Legal [place]) [phone]. When speaking with pt's legal guardian she reports wanting the current EDP to communicate with pt's current provider Dr. Hale Bogus [phone] with [facility]. She is concerned with pt's history of aggressive behaviors and verbal homicidal and suicidal threats. Based on information provided to her from pt's provider, Dr. Hale Bogus, she is unclear as to what level of care would be appropriate for this pt.  This Clinical research associate attempted to call pt's group home ([facility]). This Clinical research associate is was able to locate group home director Steward Drone [phone]). She reports she is going to contact his legal guardian because she does not agree with pt being discharged based on the events that took place yesterday at pt's outpatient provider.  This information was relayed to pt's nurse.

## 2018-04-25 NOTE — ED Notes (Signed)

## 2018-04-25 NOTE — ED Notes (Signed)
Pt given meal tray.

## 2018-04-25 NOTE — ED Notes (Signed)
SOC MD called and verbalized pt okay to discharge   We will await fax referral

## 2018-04-25 NOTE — ED Notes (Signed)
Hourly rounding reveals patient in room. No complaints, stable, in no acute distress. Q15 minute rounds and monitoring via Security Cameras to continue. 

## 2018-04-25 NOTE — ED Notes (Addendum)
Attempted to call Brenda Torain  [phone]  - message left that pt is ready for discharge. 
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
## 2018-04-25 NOTE — Discharge Instructions (Addendum)
Return to the emergency department if you develop thoughts of hurting yourself or anyone else, hallucinations, or any other symptoms concerning to you. °

## 2018-04-25 NOTE — ED Notes (Addendum)
Called his legal guardian  - I left a message for her that the pt has been ready for discharge and she is his legal guardian and what time should we expect her or a caregiver that she has specified to come and pick him up   We must await return call   TTS has spoken with group home owner and they are verbalizing that they will not be picking him up  - I made guardian aware of this on message  - also  EDP and psychiatry referral to allow regular psychiatrist that he sees weekly to adjust medications due to they are the one who prescribes the meds for him   Med adjustment not to be done in ED - primary psychiatrist to adjust meds    Pt observed lying in bed - watching TV   Pt visualized with NAD  No verbalized needs or concerns at this time  Continue to monitor

## 2018-04-25 NOTE — ED Notes (Signed)
BEHAVIORAL HEALTH ROUNDING Patient sleeping: No. Patient alert and oriented: yes Behavior appropriate: Yes.  ; If no, describe:  Nutrition and fluids offered: yes Toileting and hygiene offered: Yes  Sitter present: q15 minute observations and security  monitoring Law enforcement present: Yes  ODS  

## 2018-04-25 NOTE — ED Notes (Signed)
Report to include Situation, Background, Assessment, and Recommendations received from [facility]. Patient alert and oriented, warm and dry, in no acute distress. Patient denies SI, HI, AVH and pain. Patient made aware of Q15 minute rounds and security cameras for their safety. Patient instructed to come to me with needs or concerns.

## 2018-04-25 NOTE — ED Notes (Signed)
Pt given sprite, peanut butter, graham crackers and a popsicle. 

## 2018-04-25 NOTE — ED Notes (Signed)
Pts discharge continues to pend  - I attempted to call the group home provider again  [facility]  [phone] but received no answer - phone rang and rang and then hung up

## 2018-04-25 NOTE — BH Assessment (Addendum)
Assessment Note  Jesse Mckenzie is an 22 y.o. male who presents to ED after being seen by his mental health provider. Pt states he was told he needed a higher level of care. Pt states he became upset because he did not understand what "higher level of care" meant. Pt was then transported to ED by [employer]. He currently reports wanting to have his medications adjusted "because I'm taking too much medication". He currently lives at a [facility]. Pt denies SI/HI/AVH. Pt also denies recent alcohol/drug use. This Clinical research associate did not observe any bizarre and/or psychotic behaviors during TTS assessment. However, with history of developmental delay pt did show signs of delayed processing when communicating with this Clinical research associate.  Diagnosis: Autism Spectrum, by history   Past Medical History:  Past Medical History:  Diagnosis Date  . Autism   . Developmental delay   . Impulsive personality disorder ([facility])   . Oppositional defiant disorder     History reviewed. No pertinent surgical history.  Family History: No family history on file.  Social History:  reports that he has never smoked. He has never used smokeless tobacco. He reports that he does not drink alcohol or use drugs.  Additional Social History:  Alcohol / Drug Use Pain Medications: See PTA Prescriptions: See PTA Over the Counter: See PTA History of alcohol / drug use?: No history of alcohol / drug abuse Longest period of sobriety (when/how long): N/A  CIWA: CIWA-Ar BP: 125/82 Pulse Rate: 97 COWS:    Allergies: No Known Allergies  Home Medications:  (Not in a hospital admission)  OB/GYN Status:  No LMP for male patient.  General Assessment Data Location of Assessment: [facility] ED TTS Assessment: In system Is this a Tele or Face-to-Face Assessment?: Face-to-Face Is this an Initial Assessment or a Re-assessment for this encounter?: Initial Assessment Patient Accompanied by:: N/A Language Other than English: No Living  Arrangements: In Group Home: (Comment: Name of Group Home)([facility]) What gender do you identify as?: Male Marital status: Single Maiden name: n/a Pregnancy Status: No Living Arrangements: Group Home Can pt return to current living arrangement?: Yes Admission Status: Voluntary Is patient capable of signing voluntary admission?: No Referral Source: Self/Family/Friend Insurance type: Medicaid [place]  Medical Screening Exam [facility] Walk-in ONLY) Medical Exam completed: Yes  Crisis Care Plan Living Arrangements: Group Home Legal Guardian: Other:([facility] DSS) [phone] ) Name of Psychiatrist: [facility] Name of Therapist: n/a  Education Status Is patient currently in school?: No Is the patient employed, unemployed or receiving disability?: Receiving disability income  Risk to self with the past 6 months Suicidal Ideation: No Has patient been a risk to self within the past 6 months prior to admission? : No Suicidal Intent: No Has patient had any suicidal intent within the past 6 months prior to admission? : No Is patient at risk for suicide?: No Suicidal Plan?: No Has patient had any suicidal plan within the past 6 months prior to admission? : No Access to Means: No What has been your use of drugs/alcohol within the last 12 months?: None Reported Previous Attempts/Gestures: No How many times?: 0 Other Self Harm Risks: None Reported Triggers for Past Attempts: None known Intentional Self Injurious Behavior: None Family Suicide History: No Recent stressful life event(s): (Medications) Persecutory voices/beliefs?: No Depression: No Depression Symptoms: (None) Substance abuse history and/or treatment for substance abuse?: No Suicide prevention information given to non-admitted patients: Not applicable  Risk to Others within the  past 6 months Homicidal Ideation: No Does patient have any lifetime risk of violence toward others  beyond the six months prior to admission? : No Thoughts of Harm to Others: No Current Homicidal Intent: No Current Homicidal Plan: No Access to Homicidal Means: No Identified Victim: n/a History of harm to others?: No Assessment of Violence: None Noted Violent Behavior Description: n/a Does patient have access to weapons?: No Criminal Charges Pending?: No Does patient have a court date: No Is patient on probation?: No  Psychosis Hallucinations: None noted Delusions: None noted  Mental Status Report Appearance/Hygiene: In scrubs Eye Contact: Good Motor Activity: Freedom of movement Speech: Logical/coherent Level of Consciousness: Alert Mood: Pleasant Affect: Appropriate to circumstance Anxiety Level: None Thought Processes: Coherent, Relevant Judgement: Unimpaired Orientation: Place, Person, Time, Situation, Appropriate for developmental age Obsessive Compulsive Thoughts/Behaviors: None  Cognitive Functioning Concentration: Normal Memory: Recent Intact, Remote Intact Is patient IDD: No Insight: Fair Impulse Control: Fair Appetite: Good Have you had any weight changes? : No Change Sleep: No Change Total Hours of Sleep: 6 Vegetative Symptoms: None  ADLScreening [facility] Assessment Services) Patient's cognitive ability adequate to safely complete daily activities?: Yes Patient able to express need for assistance with ADLs?: Yes Independently performs ADLs?: Yes (appropriate for developmental age)  Prior Inpatient Therapy Prior Inpatient Therapy: Yes Prior Therapy Dates: 06/2017 Prior Therapy Facilty/Provider(s): [facility] Reason for Treatment: Depression  Prior Outpatient Therapy Prior Outpatient Therapy: Yes Prior Therapy Dates: Current Prior Therapy Facilty/Provider(s): [facility] Reason for Treatment: Depression Does patient have an ACCT team?: No Does patient have Intensive In-House Services?  : No Does patient have [facility] services? : No Does  patient have P4CC services?: No  ADL Screening (condition at time of admission) Patient's cognitive ability adequate to safely complete daily activities?: Yes Patient able to express need for assistance with ADLs?: Yes Independently performs ADLs?: Yes (appropriate for developmental age)       Abuse/Neglect Assessment (Assessment to be complete while patient is alone) Abuse/Neglect Assessment Can Be Completed: Yes Physical Abuse: Denies Verbal Abuse: Denies Sexual Abuse: Denies Exploitation of patient/patient's resources: Denies Self-Neglect: Denies Values / Beliefs Cultural Requests During Hospitalization: None Spiritual Requests During Hospitalization: None Consults Spiritual Care Consult Needed: No Social Work Consult Needed: No Merchant navy officer (For Healthcare) Does Patient Have a Medical Advance Directive?: No       Child/Adolescent Assessment Running Away Risk: (Patient is an adult)  Disposition:  Disposition Initial Assessment Completed for this Encounter: Yes Disposition of Patient: Discharge Patient refused recommended treatment: No Mode of transportation if patient is discharged?: Car  On Site Evaluation by:   Reviewed with Physician:    Wilmon Arms 04/25/2018 8:50 AM

## 2018-04-25 NOTE — ED Notes (Signed)
Report Given to SOC.  

## 2018-04-25 NOTE — ED Notes (Signed)
Waiting for ride  Moved to [facility]

## 2018-04-25 NOTE — ED Notes (Signed)
ED  Is the patient under IVC or is there intent for IVC: Yes.   Is the patient medically cleared: Yes.   Is there vacancy in the ED BHU: Yes.   Is the population mix appropriate for patient: Yes.   Is the patient awaiting placement in inpatient or outpatient setting: pt to discharge  Has the patient had a psychiatric consult: Yes.   Survey of unit performed for contraband, proper placement and condition of furniture, tampering with fixtures in bathroom, shower, and each patient room: Yes.  ; Findings:  APPEARANCE/BEHAVIOR Calm and cooperative NEURO ASSESSMENT Orientation: oriented x3  Denies pain Hallucinations: No.None noted (Hallucinations)  Denies  Speech: Normal Gait: normal RESPIRATORY ASSESSMENT Even  Unlabored respirations  CARDIOVASCULAR ASSESSMENT Pulses equal   regular rate  Skin warm and dry   GASTROINTESTINAL ASSESSMENT no GI complaint EXTREMITIES Full ROM  PLAN OF CARE Provide calm/safe environment. Vital signs assessed twice daily. ED BHU Assessment once each 12-hour shift. Collaborate with TTS daily or as condition indicates. Assure the ED provider has rounded once each shift. Provide and encourage hygiene. Provide redirection as needed. Assess for escalating behavior; address immediately and inform ED provider.  Assess family dynamic and appropriateness for visitation as needed: Yes.  ; If necessary, describe findings:  Educate the patient/family about BHU procedures/visitation: Yes.  ; If necessary, describe findings:

## 2018-04-25 NOTE — ED Notes (Signed)
Report received from [facility]  [facility] machine set up in room   Pt observed awake alert and with no unusual behavior  Appropriate to stimulation  No verbalized needs or concerns at this time  NAD assessed  Continue to monitor

## 2018-04-26 MED ORDER — HYDROXYZINE HCL 25 MG PO TABS
50.0000 mg | ORAL_TABLET | ORAL | Status: DC | PRN
  Administered 2018-04-26: 50 mg via ORAL

## 2018-04-26 MED ORDER — HYDROXYZINE HCL 25 MG PO TABS
ORAL_TABLET | ORAL | Status: AC
  Administered 2018-04-26: 50 mg via ORAL
  Filled 2018-04-26: qty 2

## 2018-04-26 NOTE — ED Notes (Signed)
Group director called and wanted to know if the patient's discharge paperwork would be ready by 6:30 p.m.  Informed her that it was ready.  She stated that a group home staff member would be here to pick up the patient at 6:30 p.m.  She stated that the patient would need to be at the front of the emergency room because the worker wouldn't be able to get out of the vehicle.

## 2018-04-26 NOTE — ED Notes (Signed)
Hourly rounding reveals patient in room. No complaints, stable, in no acute distress. Q15 minute rounds and monitoring via Security Cameras to continue. 

## 2018-04-26 NOTE — Progress Notes (Signed)
Attempted to call Lucia Bitter  [phone]  - message left that pt is ready for discharge.

## 2018-04-26 NOTE — ED Notes (Signed)
Pt taken to lobby to be picked up by group home staff.  Group home worker verbalized understanding of discharge instructions.

## 2018-04-26 NOTE — ED Notes (Signed)
Legal guardian called wanting to know if there was any collaboration between the ED MD and his outpatient psychiatrist.  Informed his legal guardian that there was a note in the patient's chart stating that the patient's outpatient psychiatrist would be the one who would need to be the one to adjust his medications.  Informed guardian that I wasn't here yesterday and I was not sure if the ED MD had spoken to the patient's outpatient psychiatrist yesterday.

## 2018-04-26 NOTE — ED Notes (Signed)
Group home director called and wanted to know if any medication changes had been made.  She wanted to know my name.  She stated that she was doing an activity with the residents at the group home today and would be unable to pick up the patient until around 6:30 p.m.

## 2018-04-26 NOTE — ED Provider Notes (Signed)
-----------------------------------------   7:06 AM on 04/26/2018 -----------------------------------------   Blood pressure 127/78, pulse 88, temperature 98 F (36.7 C), resp. rate 18, height 5\' 7"  (1.702 m), weight 86.6 kg, SpO2 99 %.  The patient had no acute events since last update.  Calm and cooperative at this time.  Disposition is pending Psychiatry/Behavioral Medicine team recommendations.    Irean Hong, MD 04/26/18 [phone]

## 2018-04-26 NOTE — Progress Notes (Signed)
Phone call made to Amy Liam Graham [facility] Guardian) [phone].  Left voicemail informing her that an attempt was made to contact the group home director, Steward Drone Torain-[phone], but the phone immediately went to voicemail when calling.  Informed her that a message was left on group director's voicemail, but if possible it would be helpful if she could reach out to the group home director and get a time that the patient could be picked up today, as he has been discharged.

## 2018-06-07 IMAGING — CR DG CHEST 2V
1 series · 2 of 2 positions shown · non-contrast
Comparison: None.

CLINICAL DATA: Autism, behavioral placement

EXAM:
CHEST  2 VIEW

[Series 1: w chest pa · 0.14mm/px · 2 of 2 slices shown]
[im 1/2]
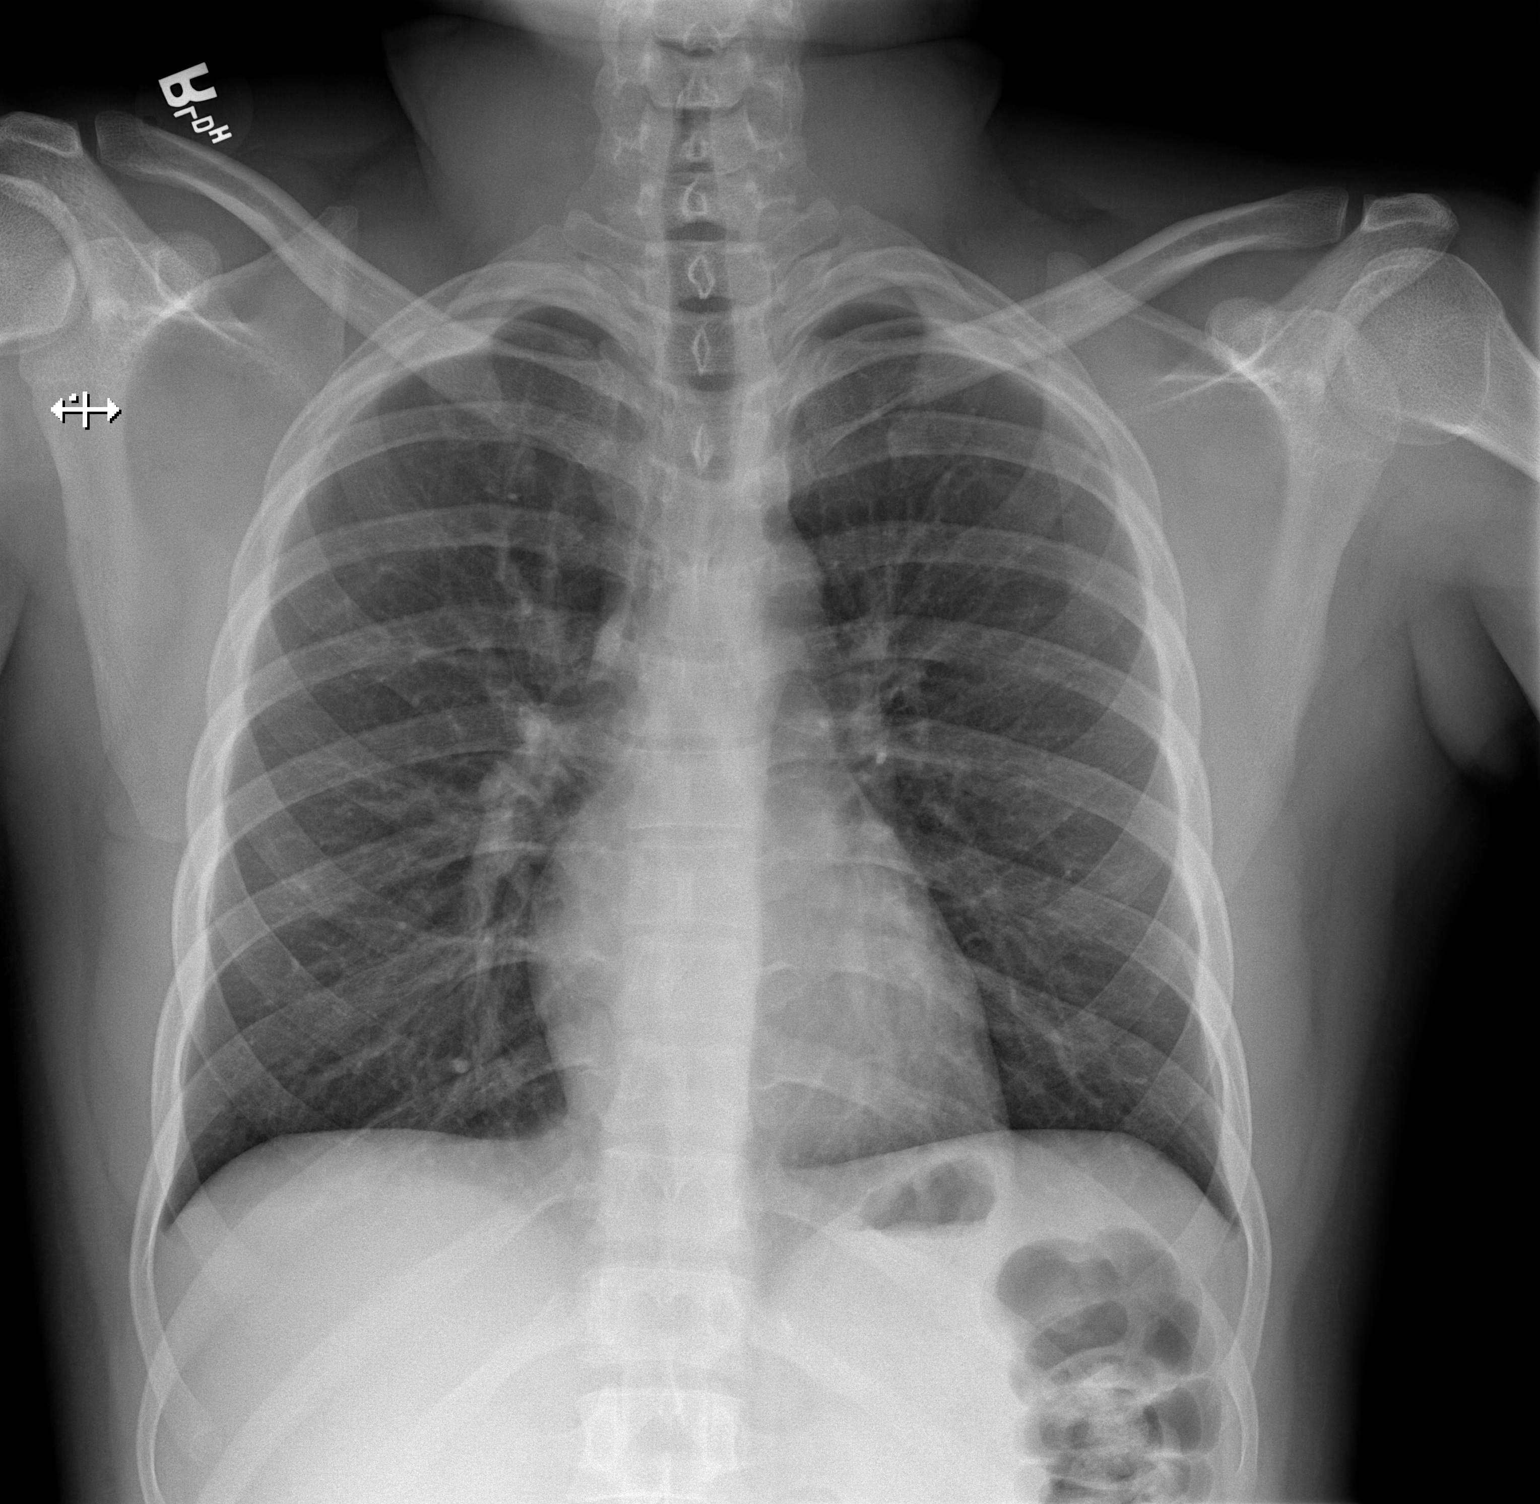
[im 2/2]
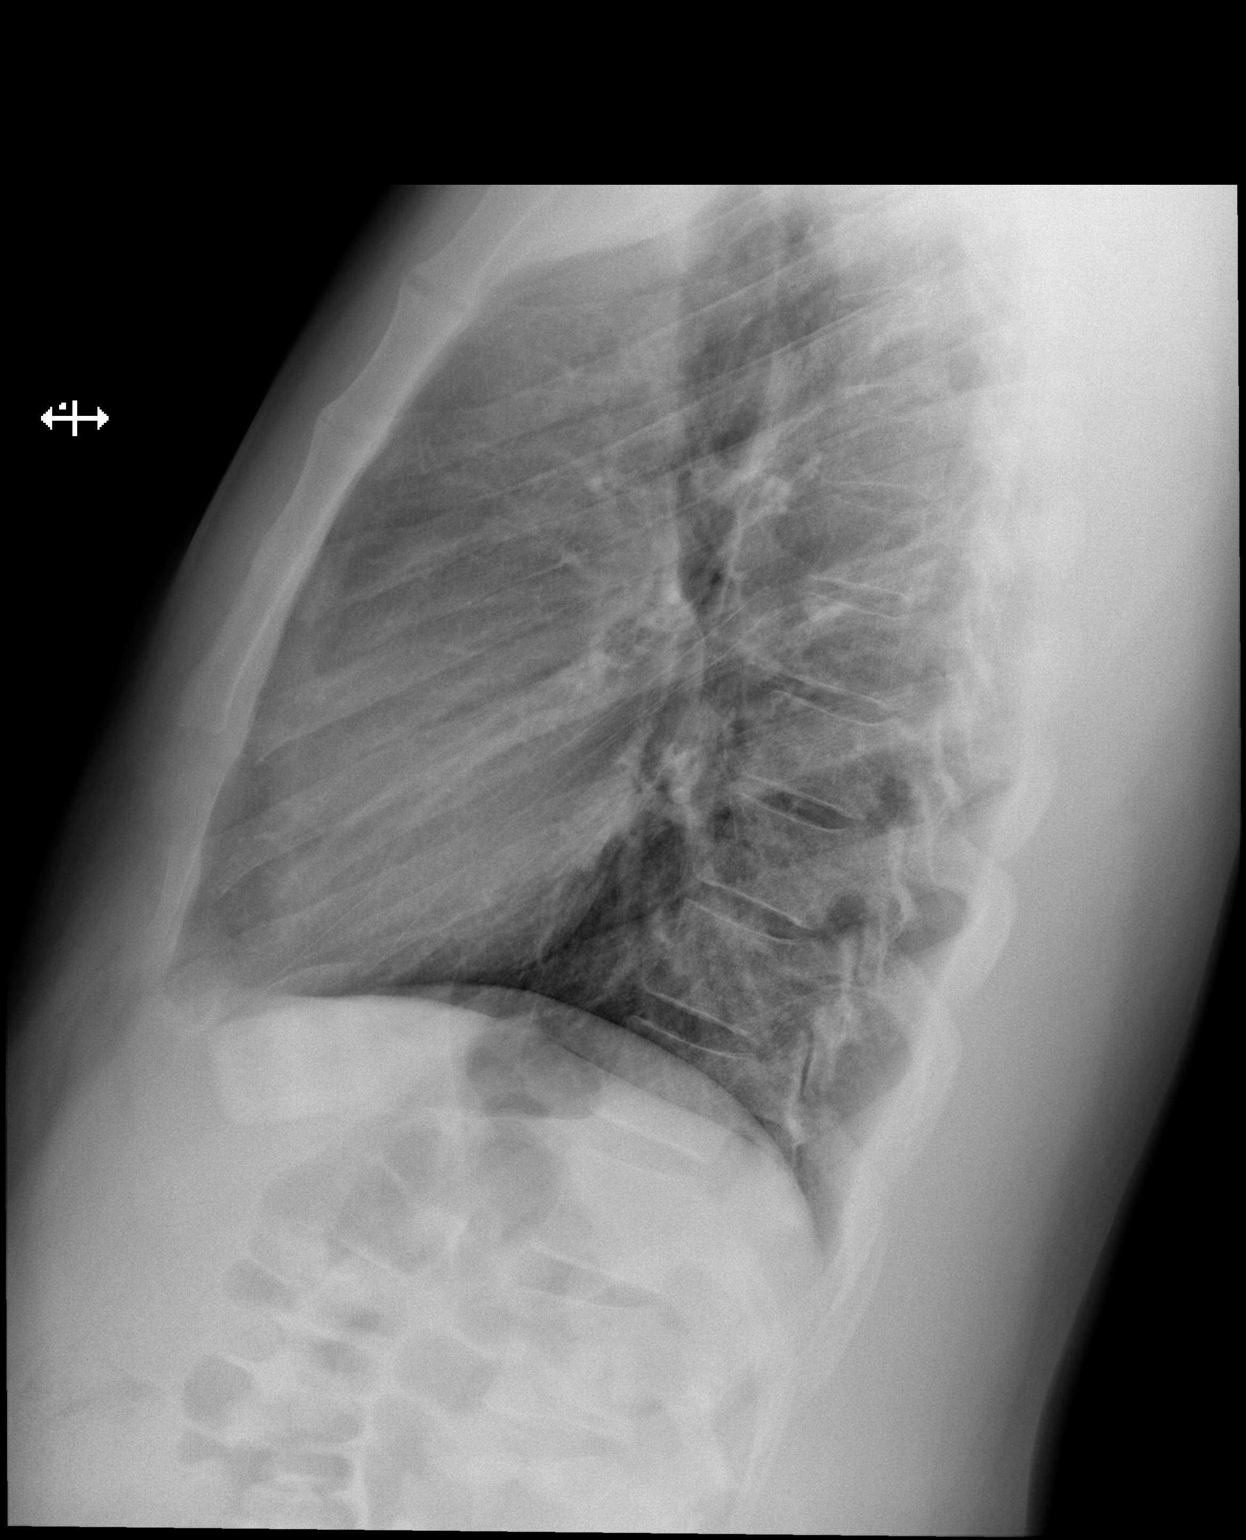

[2 of 2 positions shown; findings below may reference images not displayed]

FINDINGS: Lungs are clear.  No pleural effusion or pneumothorax.

The heart is normal in size.

Visualized osseous structures are within normal limits.
IMPRESSION: Normal chest radiographs.
